# Patient Record
Sex: Male | Born: 1960 | Race: White | Hispanic: No | Marital: Married | Smoking: Former smoker
Health system: Southern US, Community
[De-identification: ages and names within clinical notes are randomized; demographics above are authoritative.]

## PROBLEM LIST (undated history)

## (undated) DIAGNOSIS — I10 Essential (primary) hypertension: Secondary | ICD-10-CM

## (undated) HISTORY — PX: APPENDECTOMY: SHX54

---

## 1997-09-28 ENCOUNTER — Encounter: Admission: RE | Admit: 1997-09-28 | Discharge: 1997-09-28 | Payer: Self-pay | Admitting: Family Medicine

## 1997-10-17 ENCOUNTER — Encounter: Admission: RE | Admit: 1997-10-17 | Discharge: 1997-10-17 | Payer: Self-pay | Admitting: Family Medicine

## 1997-10-31 ENCOUNTER — Encounter: Admission: RE | Admit: 1997-10-31 | Discharge: 1997-10-31 | Payer: Self-pay | Admitting: Family Medicine

## 1997-11-14 ENCOUNTER — Encounter: Admission: RE | Admit: 1997-11-14 | Discharge: 1997-11-14 | Payer: Self-pay | Admitting: Family Medicine

## 1997-11-23 ENCOUNTER — Encounter: Admission: RE | Admit: 1997-11-23 | Discharge: 1997-11-23 | Payer: Self-pay | Admitting: Family Medicine

## 2001-09-07 ENCOUNTER — Ambulatory Visit (HOSPITAL_COMMUNITY): Admission: RE | Admit: 2001-09-07 | Discharge: 2001-09-07 | Payer: Self-pay | Admitting: Internal Medicine

## 2001-09-07 ENCOUNTER — Encounter: Payer: Self-pay | Admitting: Internal Medicine

## 2001-09-09 ENCOUNTER — Encounter: Payer: Self-pay | Admitting: *Deleted

## 2001-09-10 ENCOUNTER — Observation Stay (HOSPITAL_COMMUNITY): Admission: EM | Admit: 2001-09-10 | Discharge: 2001-09-10 | Payer: Self-pay | Admitting: Emergency Medicine

## 2001-10-19 ENCOUNTER — Emergency Department (HOSPITAL_COMMUNITY): Admission: EM | Admit: 2001-10-19 | Discharge: 2001-10-19 | Payer: Self-pay | Admitting: Emergency Medicine

## 2004-02-21 ENCOUNTER — Ambulatory Visit: Payer: Self-pay | Admitting: Family Medicine

## 2005-03-18 ENCOUNTER — Ambulatory Visit: Payer: Self-pay | Admitting: Family Medicine

## 2005-09-04 ENCOUNTER — Ambulatory Visit: Payer: Self-pay | Admitting: Family Medicine

## 2012-06-01 ENCOUNTER — Emergency Department (HOSPITAL_COMMUNITY)
Admission: EM | Admit: 2012-06-01 | Discharge: 2012-06-01 | Disposition: A | Discharge status: Home / Self Care | Payer: PRIVATE HEALTH INSURANCE | Attending: Emergency Medicine | Admitting: Emergency Medicine

## 2012-06-01 ENCOUNTER — Encounter (HOSPITAL_COMMUNITY): Payer: Self-pay | Admitting: Emergency Medicine

## 2012-06-01 DIAGNOSIS — R42 Dizziness and giddiness: Secondary | ICD-10-CM

## 2012-06-01 DIAGNOSIS — H539 Unspecified visual disturbance: Secondary | ICD-10-CM | POA: Insufficient documentation

## 2012-06-01 DIAGNOSIS — F17200 Nicotine dependence, unspecified, uncomplicated: Secondary | ICD-10-CM | POA: Insufficient documentation

## 2012-06-01 DIAGNOSIS — I10 Essential (primary) hypertension: Secondary | ICD-10-CM | POA: Insufficient documentation

## 2012-06-01 DIAGNOSIS — Z79899 Other long term (current) drug therapy: Secondary | ICD-10-CM | POA: Insufficient documentation

## 2012-06-01 HISTORY — DX: Essential (primary) hypertension: I10

## 2012-06-01 LAB — COMPREHENSIVE METABOLIC PANEL
ALT: 37 U/L (ref 0–53)
AST: 24 U/L (ref 0–37)
Albumin: 4.2 g/dL (ref 3.5–5.2)
Alkaline Phosphatase: 101 U/L (ref 39–117)
BUN: 9 mg/dL (ref 6–23)
CO2: 25 mEq/L (ref 19–32)
Calcium: 9.9 mg/dL (ref 8.4–10.5)
Chloride: 103 mEq/L (ref 96–112)
Creatinine, Ser: 0.73 mg/dL (ref 0.50–1.35)
GFR calc Af Amer: >90 mL/min (ref >90)
GFR calc non Af Amer: >90 mL/min (ref >90)
Glucose, Bld: 119 mg/dL — ABNORMAL HIGH (ref 70–99)
Potassium: 4.1 mEq/L (ref 3.5–5.1)
Sodium: 138 mEq/L (ref 135–145)
Total Bilirubin: 0.4 mg/dL (ref 0.3–1.2)
Total Protein: 7.9 g/dL (ref 6.0–8.3)

## 2012-06-01 LAB — CBC WITH DIFFERENTIAL/PLATELET
Basophils Absolute: 0 10*3/uL (ref 0.0–0.1)
Basophils Relative: 0 % (ref 0–1)
Eosinophils Absolute: 0.2 10*3/uL (ref 0.0–0.7)
Eosinophils Relative: 2 % (ref 0–5)
HCT: 46.1 % (ref 39.0–52.0)
Hemoglobin: 16.1 g/dL (ref 13.0–17.0)
Lymphocytes Relative: 22 % (ref 12–46)
Lymphs Abs: 1.9 10*3/uL (ref 0.7–4.0)
MCH: 30.3 pg (ref 26.0–34.0)
MCHC: 34.9 g/dL (ref 30.0–36.0)
MCV: 86.7 fL (ref 78.0–100.0)
Monocytes Absolute: 0.6 10*3/uL (ref 0.1–1.0)
Monocytes Relative: 7 % (ref 3–12)
Neutro Abs: 5.8 10*3/uL (ref 1.7–7.7)
Neutrophils Relative %: 68 % (ref 43–77)
Platelets: 288 10*3/uL (ref 150–400)
RBC: 5.32 MIL/uL (ref 4.22–5.81)
RDW: 13 % (ref 11.5–15.5)
WBC: 8.5 10*3/uL (ref 4.0–10.5)

## 2012-06-01 MED ORDER — MECLIZINE HCL 12.5 MG PO TABS
25.0000 mg | ORAL_TABLET | Freq: Once | ORAL | Status: AC
  Administered 2012-06-01: 25 mg via ORAL
  Filled 2012-06-01: qty 2

## 2012-06-01 MED ORDER — MECLIZINE HCL 25 MG PO TABS: 25.0000 mg | ORAL_TABLET | Freq: Four times a day (QID) | ORAL | Status: DC | PRN

## 2012-06-01 NOTE — ED Notes (Signed)
Patient and visitor upset with wait time. Explained that DR Lynelle Doctor is working on discharge papers right now. Patient redirected back to room. Patient's visitor left upset and did not wait with patient.

## 2012-06-01 NOTE — ED Provider Notes (Signed)
History  This chart was scribed for Ward Givens, MD by Bennett Scrape, ED Scribe. This patient was seen in room APA17/APA17 and the patient's care was started at 2:13 PM.  CSN: [id number]  Arrival date & time 06/01/12  1355   First MD Initiated Contact with Patient 06/01/12 1413      Chief Complaint  Patient presents with  . Hypertension    Patient is a 52 y.o. male presenting with hypertension. The history is provided by the patient. No language interpreter was used.  Hypertension This is a recurrent problem. The current episode started less than 1 hour ago. The problem occurs constantly. The problem has not changed since onset.Pertinent negatives include no chest pain, no abdominal pain, no headaches and no shortness of breath. Nothing aggravates the symptoms. Nothing relieves the symptoms. He has tried nothing for the symptoms.    Scott Robbins is a 52 y.o. male who presents to the Emergency Department complaining of 7 AM of intermittent 30 second episodes of dizziness described as room spinning with changing postions with associated trouble balancing and blurred vision. Pt was seen Dr. Wende Crease at an [facility] on [address] today for the same and was sent here for HTN. BP is 188/103 in the ED. He states that he had his BP checked last year and was alittle high but has not checked it since. He denies having a family h/o HTN. He denies nausea, emesis, headache,CP, and SOB as associated symptoms. He states he had blurred vision once today. He has a flash of chest pain once while registering to the ED that lasted a second.   He is a current everyday smoker (0.5 ppd) and daily alcohol user (6-12 beers nightly to help him "relax").  PCP none  Past Medical History  Diagnosis Date  . Hypertension     Past Surgical History  Procedure Date  . Appendectomy     History reviewed. No pertinent family history.  AODM CAD  History  Substance Use Topics  .  Smoking status: Current Every Day Smoker 1/2 ppd  . Smokeless tobacco: Not on file  . Alcohol Use: Yes     Comment: daily, beer   Employed for the [employer] [place] Drinks 6-12 beers nightly "to relax me so I can sleep"   Review of Systems  Eyes: Positive for visual disturbance.  Respiratory: Negative for cough and shortness of breath.   Cardiovascular: Negative for chest pain.  Gastrointestinal: Negative for nausea, vomiting and abdominal pain.  Neurological: Positive for dizziness. Negative for headaches.  All other systems reviewed and are negative.    Allergies  Review of patient's allergies indicates no known allergies.  Home Medications   Current Outpatient Rx  Name  Route  Sig  Dispense  Refill  . THERAFLU FLU/COLD/SORE THROAT PO   Oral   Take 30 mLs by mouth every 4 (four) hours as needed. Cold/congestion.         Marland Kitchen DIPHENHYDRAMINE HCL 25 MG PO TABS   Oral   Take 50 mg by mouth every 6 (six) hours as needed. Allergies.         Doreatha Martin MULTI-SYMPTOM PO   Oral   Take 1 capsule by mouth every 4 (four) hours as needed. Cold/congestion.         Dorothyann Peng MULTI-SYMPTOM PO   Oral   Take 1 capsule by mouth every 4 (four) hours as needed. Cold/congestion.  Triage Vitals: BP 170/86  Pulse 86  Temp 98.1 F (36.7 C)  Resp 19  SpO2 99%  Vital signs normal except hypertension   Physical Exam  Nursing note and vitals reviewed. Constitutional: He is oriented to person, place, and time. He appears well-developed and well-nourished.  Non-toxic appearance. He does not appear ill. No distress.  HENT:  Head: Normocephalic and atraumatic.  Right Ear: External ear normal.  Left Ear: External ear normal.  Nose: Nose normal. No mucosal edema or rhinorrhea.  Mouth/Throat: Oropharynx is clear and moist and mucous membranes are normal. No dental abscesses or uvula swelling.  Eyes: Conjunctivae normal and EOM are normal. Pupils are equal, round, and  reactive to light.  Neck: Normal range of motion and full passive range of motion without pain. Neck supple.  Cardiovascular: Normal rate, regular rhythm and normal heart sounds.  Exam reveals no gallop and no friction rub.   No murmur heard. Pulmonary/Chest: Effort normal and breath sounds normal. No respiratory distress. He has no wheezes. He has no rhonchi. He has no rales. He exhibits no tenderness and no crepitus.  Abdominal: Soft. Normal appearance and bowel sounds are normal. He exhibits no distension. There is no tenderness. There is no rebound and no guarding.  Musculoskeletal: Normal range of motion. He exhibits no edema and no tenderness.       Moves all extremities well.   Neurological: He is alert and oriented to person, place, and time. He has normal strength. No cranial nerve deficit.  Skin: Skin is warm, dry and intact. No rash noted. No erythema. No pallor.  Psychiatric: He has a normal mood and affect. His speech is normal and behavior is normal. His mood appears not anxious.    ED Course  Procedures (including critical care time)   Medications  meclizine (ANTIVERT) tablet 25 mg (25 mg Oral Given 06/01/12 1443)     DIAGNOSTIC STUDIES: Oxygen Saturation is 99% on room air, normal by my interpretation.    COORDINATION OF CARE: 2:34 PM- Advised pt that alcohol can cause HTN. Wife defends he needs to drink alcohol to sleep.  Discussed treatment plan which includes meclizine and CBC panel with pt at bedside and pt agreed to plan.   14:45 PM- Ordered 25 mg Antivert tablet  15:30 pt is able to stand up with mild light headedness, waiting for his BP to be checked again.  16:45 BP has improved to 139/91 without treatment. Wife upset, states she has called and can't get him a PCP, advised they can go back to the urgent care they went to today to be reseen. She is also upset that he isn't getting HTN meds, but at this point they aren't indicated. Pt wife states she has a blood  pressure monitor at home.    Results for orders placed during the hospital encounter of 06/01/12  CBC WITH DIFFERENTIAL      Component Value Range   WBC 8.5  4.0 - 10.5 K/uL   RBC 5.32  4.22 - 5.81 MIL/uL   Hemoglobin 16.1  13.0 - 17.0 g/dL   HCT 14.7  82.9 - 56.2 %   MCV 86.7  78.0 - 100.0 fL   MCH 30.3  26.0 - 34.0 pg   MCHC 34.9  30.0 - 36.0 g/dL   RDW 13.0  86.5 - 78.4 %   Platelets 288  150 - 400 K/uL   Neutrophils Relative 68  43 - 77 %   Neutro Abs 5.8  1.7 - 7.7 K/uL   Lymphocytes Relative 22  12 - 46 %   Lymphs Abs 1.9  0.7 - 4.0 K/uL   Monocytes Relative 7  3 - 12 %   Monocytes Absolute 0.6  0.1 - 1.0 K/uL   Eosinophils Relative 2  0 - 5 %   Eosinophils Absolute 0.2  0.0 - 0.7 K/uL   Basophils Relative 0  0 - 1 %   Basophils Absolute 0.0  0.0 - 0.1 K/uL  COMPREHENSIVE METABOLIC PANEL      Component Value Range   Sodium 138  135 - 145 mEq/L   Potassium 4.1  3.5 - 5.1 mEq/L   Chloride 103  96 - 112 mEq/L   CO2 25  19 - 32 mEq/L   Glucose, Bld 119 (*) 70 - 99 mg/dL   BUN 9  6 - 23 mg/dL   Creatinine, Ser 4.09  0.50 - 1.35 mg/dL   Calcium 9.9  8.4 - 81.1 mg/dL   Total Protein 7.9  6.0 - 8.3 g/dL   Albumin 4.2  3.5 - 5.2 g/dL   AST 24  0 - 37 U/L   ALT 37  0 - 53 U/L   Alkaline Phosphatase 101  39 - 117 U/L   Total Bilirubin 0.4  0.3 - 1.2 mg/dL   GFR calc non Af Amer >90  >90 mL/min   GFR calc Af Amer >90  >90 mL/min   Laboratory interpretation all normal except non-fasting glucose    1. Dizziness     New Prescriptions   MECLIZINE (ANTIVERT) 25 MG TABLET    Take 1 tablet (25 mg total) by mouth 4 (four) times daily as needed for dizziness.    Plan discharge  Devoria Albe, MD, FACEP   MDM    I personally performed the services described in this documentation, which was scribed in my presence. The recorded information has been reviewed and considered.  Devoria Albe, MD, Armando Gang    Ward Givens, MD 06/01/12 [phone]

## 2012-06-01 NOTE — ED Notes (Signed)
Pt seen at urgent care today due to dizziness. Had high bp and sent here. Dizziness mostly with moving around. Stable on feet. Denies h/a. C/o sharp pain "running up my chest" for about 2 seconds earlier. Nad. No pcp. Noncompliant. intermittant blurred vision x 3-4 weeks. Denies weakness, trouble swallowing or slurred speech

## 2012-08-03 ENCOUNTER — Encounter (INDEPENDENT_AMBULATORY_CARE_PROVIDER_SITE_OTHER): Payer: Self-pay | Admitting: *Deleted

## 2012-08-30 ENCOUNTER — Other Ambulatory Visit (HOSPITAL_COMMUNITY): Payer: Self-pay | Admitting: Internal Medicine

## 2012-08-30 DIAGNOSIS — E0590 Thyrotoxicosis, unspecified without thyrotoxic crisis or storm: Secondary | ICD-10-CM

## 2012-09-01 ENCOUNTER — Ambulatory Visit (HOSPITAL_COMMUNITY)
Admission: RE | Admit: 2012-09-01 | Discharge: 2012-09-01 | Disposition: A | Discharge status: Home / Self Care | Payer: PRIVATE HEALTH INSURANCE | Source: Ambulatory Visit | Attending: Internal Medicine | Admitting: Internal Medicine

## 2012-09-01 DIAGNOSIS — E0590 Thyrotoxicosis, unspecified without thyrotoxic crisis or storm: Secondary | ICD-10-CM | POA: Insufficient documentation

## 2012-09-01 IMAGING — US US SOFT TISSUE HEAD/NECK
1 series · 14 of 25 positions shown · non-contrast
Comparison: none

Thyroid ultrasound
HISTORY: Hyperthyroidism.

[Series 1: us soft tissue head/neck · 0.07mm/px · 14 of 29 slices shown]
[im 1/29]
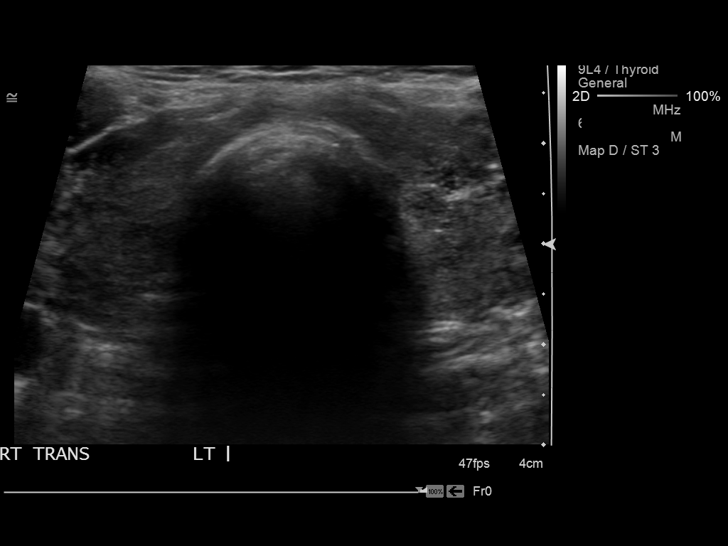
[im 3/29]
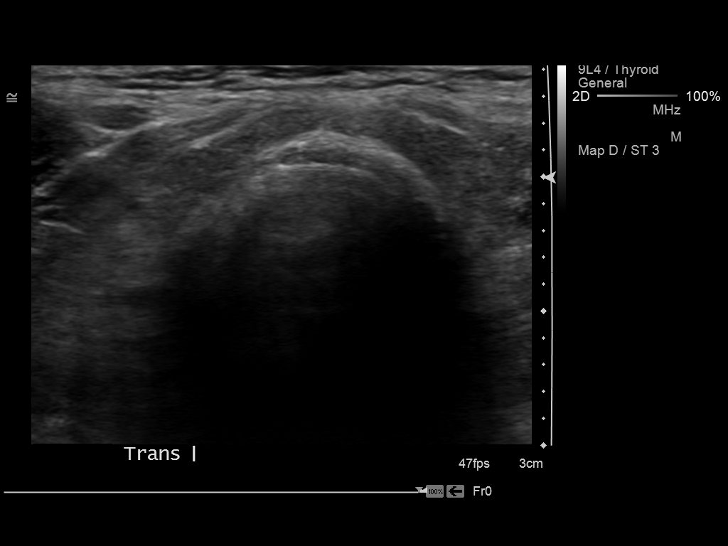
[im 5/29]
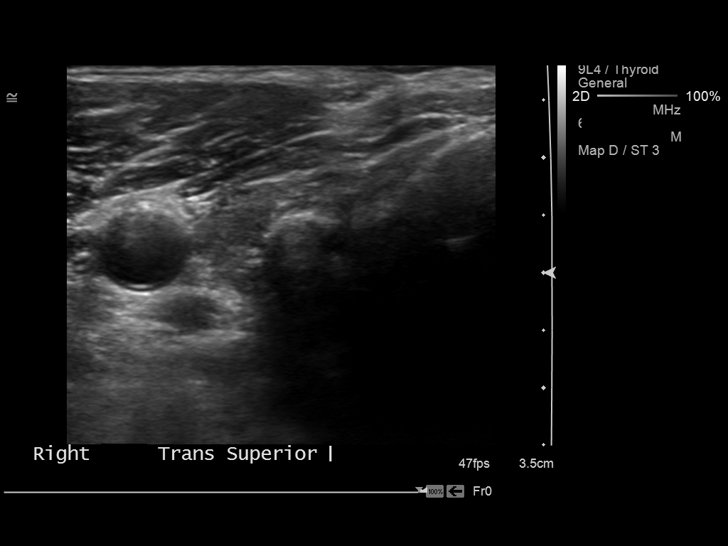
[im 8/29]
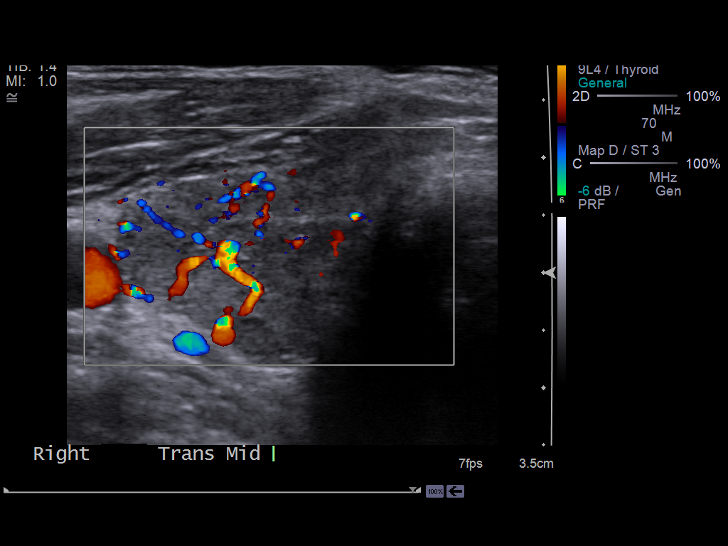
[im 10/29]
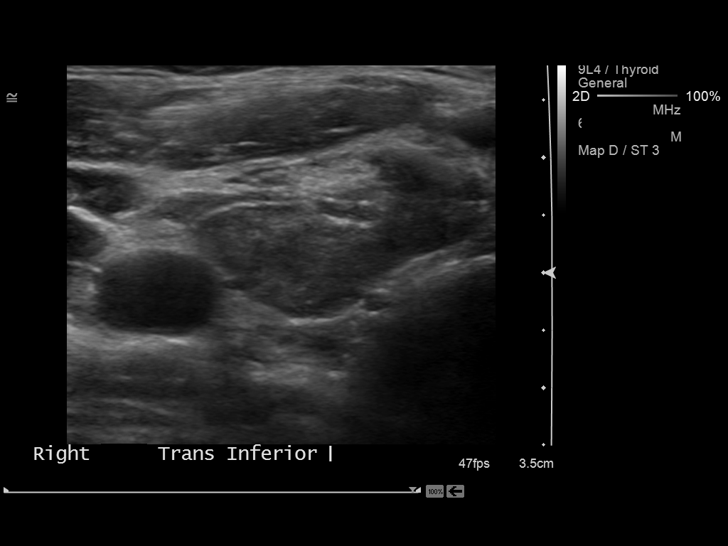
[im 11/29]
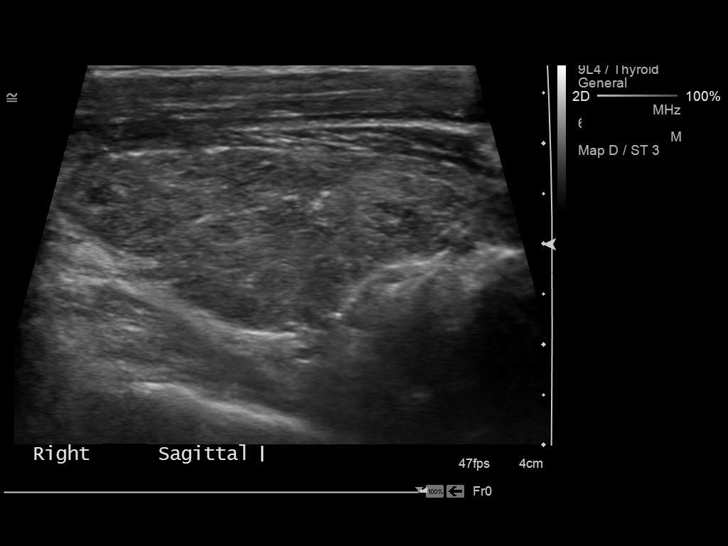
[im 13/29]
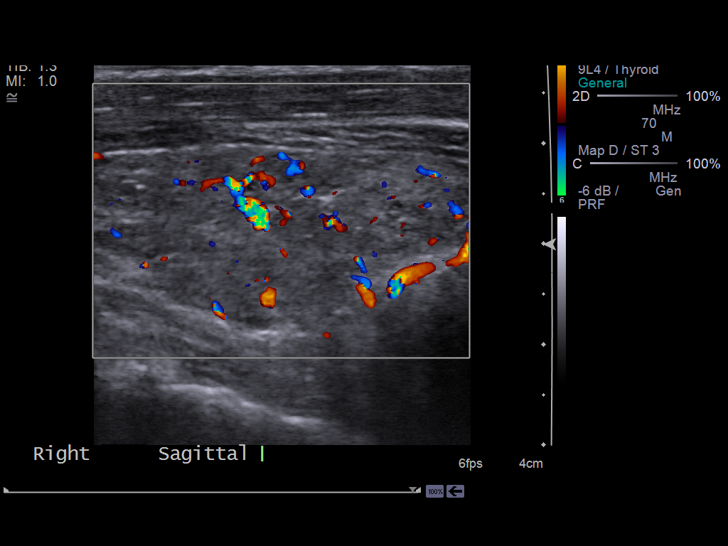
[im 16/29]
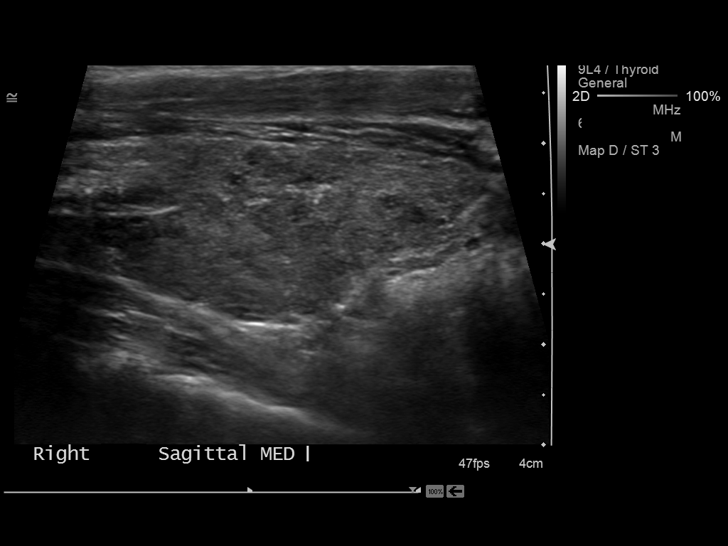
[im 18/29]
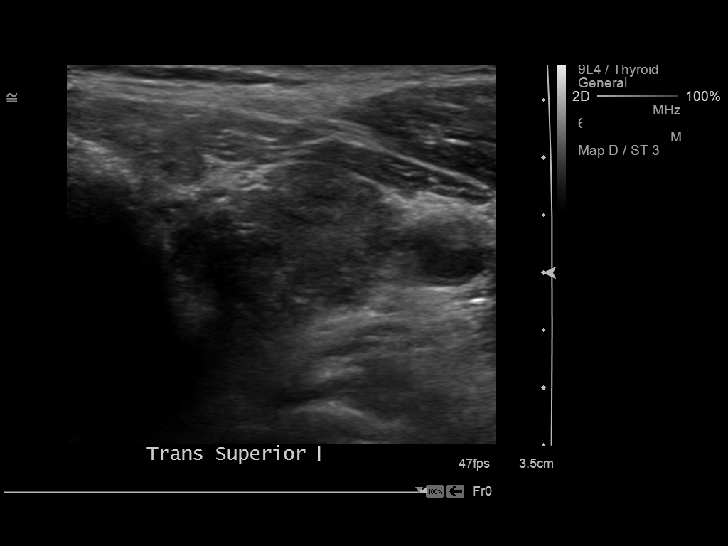
[im 19/29]
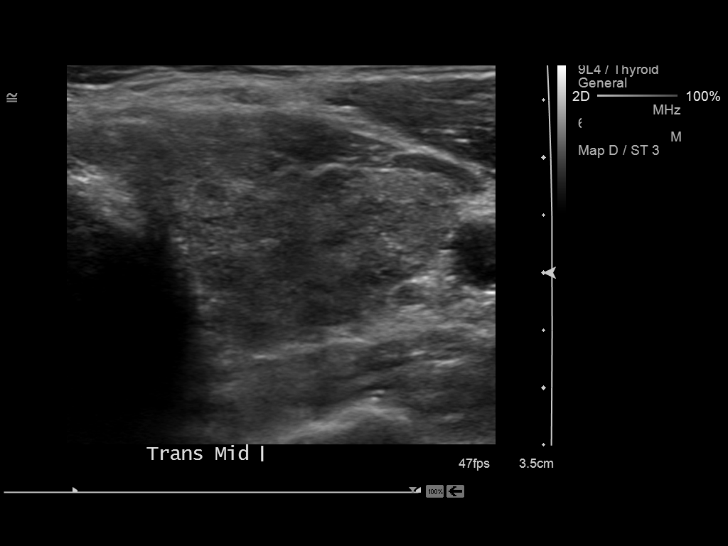
[im 22/29]
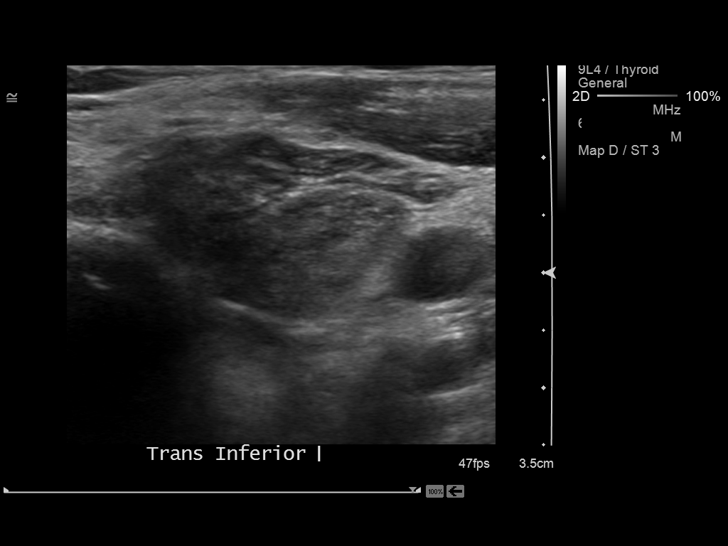
[im 24/29]
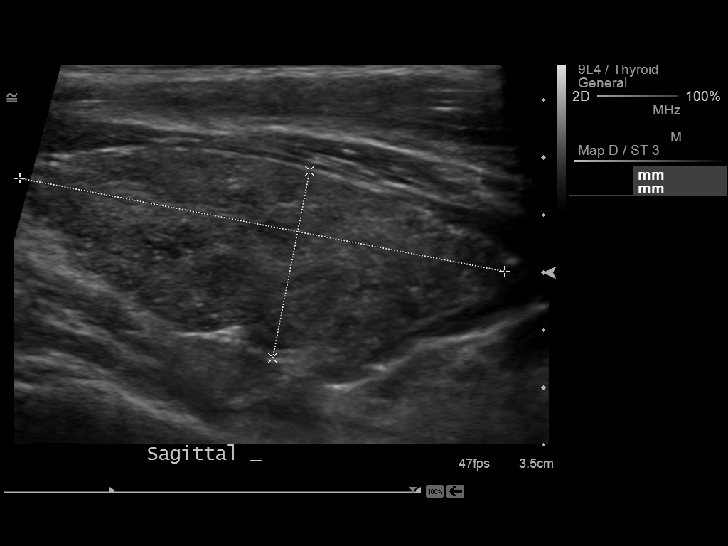
[im 26/29]
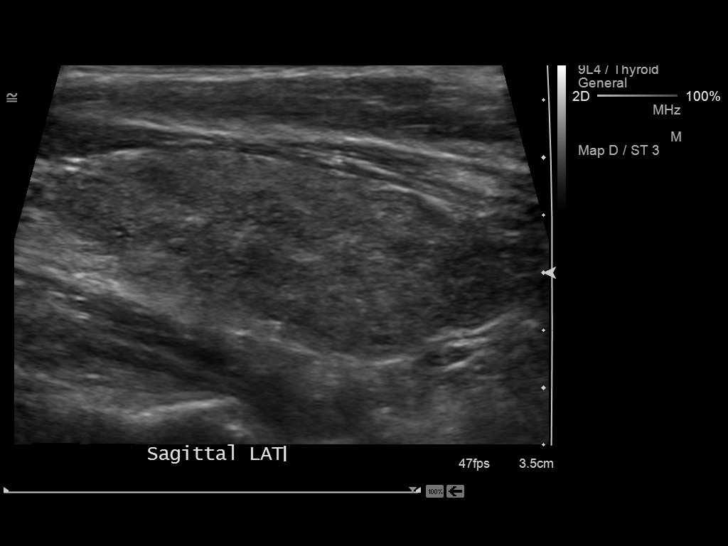
[im 29/29]
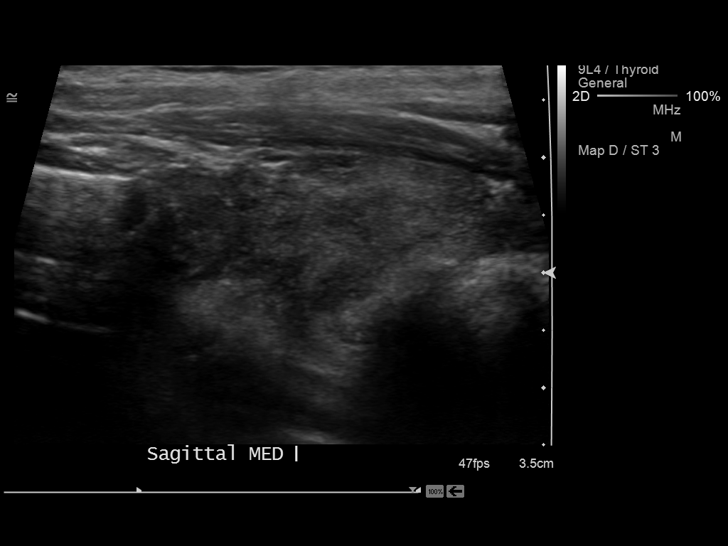

[14 of 25 positions shown; findings below may reference images not displayed]

FINDINGS: The right lobe measures 4.7 x 1.9 x 2.2 cm in size.  The
left lobe measures 4.3 x 1.7 x 2.4 cm in size.  The isthmus
measures 2 mm in thickness.

There is diffuse inhomogeneity throughout the thyroid without focal
mass.  No calcifications are noted.  The thyroid shows moderate
diffuse vascularity.

There are no masses or adenopathy adjacent to the thyroid.
CONCLUSION: Diffuse inhomogeneity to the echotexture of the
thyroid without focal mass.  While this appearance may be seen with
multinodular goiter, this appearance is consistent with thyroiditis
or Graves disease.  Appropriate laboratory correlation with respect
to potential thyroiditis or Graves disease is advised.  Study
otherwise unremarkable.

## 2013-09-08 ENCOUNTER — Other Ambulatory Visit (HOSPITAL_COMMUNITY): Payer: Self-pay | Admitting: "Endocrinology

## 2013-09-08 DIAGNOSIS — E0590 Thyrotoxicosis, unspecified without thyrotoxic crisis or storm: Secondary | ICD-10-CM

## 2013-09-12 ENCOUNTER — Encounter (HOSPITAL_COMMUNITY)
Admission: RE | Admit: 2013-09-12 | Discharge: 2013-09-12 | Disposition: A | Discharge status: Home / Self Care | Payer: PRIVATE HEALTH INSURANCE | Source: Ambulatory Visit | Attending: "Endocrinology | Admitting: "Endocrinology

## 2013-09-12 ENCOUNTER — Encounter (HOSPITAL_COMMUNITY): Payer: Self-pay

## 2013-09-12 DIAGNOSIS — E0590 Thyrotoxicosis, unspecified without thyrotoxic crisis or storm: Secondary | ICD-10-CM | POA: Insufficient documentation

## 2013-09-12 MED ORDER — SODIUM IODIDE I 131 CAPSULE
9.0000 | Freq: Once | INTRAVENOUS | Status: AC | PRN
  Administered 2013-09-12: 9 via ORAL

## 2013-09-13 ENCOUNTER — Encounter (HOSPITAL_COMMUNITY)
Admission: RE | Admit: 2013-09-13 | Discharge: 2013-09-13 | Disposition: A | Discharge status: Home / Self Care | Payer: PRIVATE HEALTH INSURANCE | Source: Ambulatory Visit | Attending: "Endocrinology | Admitting: "Endocrinology

## 2013-09-13 ENCOUNTER — Encounter (HOSPITAL_COMMUNITY): Payer: Self-pay

## 2013-09-13 MED ORDER — SODIUM PERTECHNETATE TC 99M INJECTION
10.0000 | Freq: Once | INTRAVENOUS | Status: AC | PRN
  Administered 2013-09-13: 10 via INTRAVENOUS

## 2016-12-31 ENCOUNTER — Telehealth: Payer: Self-pay

## 2016-12-31 NOTE — Telephone Encounter (Addendum)
Gastroenterology Pre-Procedure Review  Request Date: Requesting Physician: DR.HALL  FIRST [facility]CS   PATIENT REVIEW QUESTIONS: The patient responded to the following health history questions as indicated:    1. Diabetes Melitis: NO 2. Joint replacements in the past 12 months: NO 3. Major health problems in the past 3 months: NO 4. Has an artificial valve or MVP: NO 5. Has a defibrillator: NO 6. Has been advised in past to take antibiotics in advance of a procedure like teeth cleaning: NO 7. Family history of colon cancer: NO 8. Alcohol Use: 6 PACK A WEEK 9. History of sleep apnea: NO 10. History of coronary artery or other vascular stents placed within the last 12 months: NO 11. History of any prior anesthesia complications: NO    MEDICATIONS & ALLERGIES:    Patient reports the following regarding taking any blood thinners:   Plavix? NO Aspirin? NO Coumadin? NO Brilinta? NO Xarelto? NO Eliquis? NO Pradaxa? NO Savaysa? NO Effient? NO  Patient confirms/reports the following medications:  Current Outpatient Prescriptions  Medication Sig Dispense Refill  . atenolol (TENORMIN) 50 MG tablet Take 50 mg by mouth daily.    . Omega-3 Fatty Acids (FISH OIL) 1200 MG CPDR Take 1,200 mg by mouth daily.     No current facility-administered medications for this visit.     Patient confirms/reports the following allergies:  No Known Allergies  No orders of the defined types were placed in this encounter.   AUTHORIZATION INFORMATION Primary Insurance: [employer],  ID #: [id number] ,  Group #: [id number]  Pre-Cert / Auth required:  Pre-Cert / Auth #:   Secondary Insurance: ,  ID #: ,  Group #:  Pre-Cert / Auth required:  Pre-Cert / Auth #:   SCHEDULE INFORMATION: Procedure has been scheduled as follows:  Date: , Time:   Location:   This Gastroenterology Pre-Precedure Review Form is being routed to the following provider(s):

## 2017-01-07 NOTE — Telephone Encounter (Signed)
Ok to schedule with 12.5 mg preprocedure Phenergan due to 6 pack of beer a week

## 2017-01-07 NOTE — Telephone Encounter (Signed)
Tried to call with no answer  

## 2017-01-25 NOTE — Telephone Encounter (Signed)
Tried to call with no answer. I will mail out a letter for him to call us.

## 2017-02-01 ENCOUNTER — Telehealth: Payer: Self-pay | Admitting: Gastroenterology

## 2017-02-01 ENCOUNTER — Other Ambulatory Visit: Payer: Self-pay

## 2017-02-01 DIAGNOSIS — Z1211 Encounter for screening for malignant neoplasm of colon: Secondary | ICD-10-CM

## 2017-02-01 MED ORDER — PEG 3350-KCL-NA BICARB-NACL 420 G PO SOLR: 4000.0000 mL | ORAL | 0 refills | Status: DC

## 2017-02-01 NOTE — Addendum Note (Signed)
Addended by: Everardo All on: 02/01/2017 10:55 AM   Modules accepted: Orders

## 2017-02-01 NOTE — Telephone Encounter (Signed)
[phone]  PLEASE CALL PATIENT WIFE SHE HAS QUESTIONS ABOUT HER HUSBANDS TCS THAT IS UPCOMING

## 2017-02-01 NOTE — Telephone Encounter (Signed)
I spoke to pt's wife and pt is scheduled for 02/12/2017 at 1:15 pm with Dr. Gala Romney. Rx sent to the pharmacy and instructions mailed to pt.

## 2017-02-01 NOTE — Telephone Encounter (Signed)
See triage

## 2017-02-05 ENCOUNTER — Other Ambulatory Visit: Payer: Self-pay

## 2017-02-11 ENCOUNTER — Telehealth: Payer: Self-pay | Admitting: Internal Medicine
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
## 2017-02-15 NOTE — Telephone Encounter (Signed)
Routing to clinical pool to reschedule procedure

## 2017-02-15 NOTE — Telephone Encounter (Signed)
Unable to contact pt. Phone rings busy. Wife is listed as emergency contact with the same number. Letter mailed to pt.

## 2017-02-17 ENCOUNTER — Other Ambulatory Visit: Payer: Self-pay

## 2017-02-17 MED ORDER — PEG 3350-KCL-NA BICARB-NACL 420 G PO SOLR: 4000.0000 mL | ORAL | 0 refills | Status: DC

## 2017-02-17 NOTE — Telephone Encounter (Signed)
Pt's wife called office to reschedule his colonoscopy with RMR. Procedure rescheduled to 02/26/17 at 3:00pm. He had already drank his prep. New prep rx sent to pharmacy. She requested for instructions to be faxed to pharmacy. Instructions faxed. LMOVM for Endo scheduler.

## 2017-02-26 ENCOUNTER — Encounter (HOSPITAL_COMMUNITY): Payer: Self-pay | Admitting: *Deleted

## 2017-02-26 ENCOUNTER — Ambulatory Visit (HOSPITAL_COMMUNITY)
Admission: RE | Admit: 2017-02-26 | Discharge: 2017-02-26 | Disposition: A | Discharge status: Home / Self Care | Payer: PRIVATE HEALTH INSURANCE | Source: Ambulatory Visit | Attending: Internal Medicine | Admitting: Internal Medicine

## 2017-02-26 ENCOUNTER — Encounter (HOSPITAL_COMMUNITY)
Admission: RE | Disposition: A | Discharge status: Home / Self Care | Payer: Self-pay | Source: Ambulatory Visit | Attending: Internal Medicine

## 2017-02-26 DIAGNOSIS — Z1211 Encounter for screening for malignant neoplasm of colon: Secondary | ICD-10-CM | POA: Diagnosis not present

## 2017-02-26 DIAGNOSIS — F17210 Nicotine dependence, cigarettes, uncomplicated: Secondary | ICD-10-CM | POA: Diagnosis not present

## 2017-02-26 DIAGNOSIS — Z79899 Other long term (current) drug therapy: Secondary | ICD-10-CM | POA: Insufficient documentation

## 2017-02-26 DIAGNOSIS — D124 Benign neoplasm of descending colon: Secondary | ICD-10-CM | POA: Diagnosis not present

## 2017-02-26 DIAGNOSIS — Z1212 Encounter for screening for malignant neoplasm of rectum: Secondary | ICD-10-CM | POA: Diagnosis not present

## 2017-02-26 DIAGNOSIS — I10 Essential (primary) hypertension: Secondary | ICD-10-CM | POA: Insufficient documentation

## 2017-02-26 HISTORY — PX: COLONOSCOPY: SHX5424

## 2017-02-26 HISTORY — PX: POLYPECTOMY: SHX5525

## 2017-02-26 SURGERY — COLONOSCOPY
Anesthesia: Moderate Sedation

## 2017-02-26 MED ORDER — SIMETHICONE 40 MG/0.6ML PO SUSP
ORAL | Status: DC | PRN
  Administered 2017-02-26: 2.5 mL

## 2017-02-26 MED ORDER — MIDAZOLAM HCL 5 MG/5ML IJ SOLN
INTRAMUSCULAR | Status: DC | PRN
  Administered 2017-02-26 (×2): 2 mg via INTRAVENOUS

## 2017-02-26 MED ORDER — MEPERIDINE HCL 100 MG/ML IJ SOLN
INTRAMUSCULAR | Status: DC | PRN
Start: 2017-02-26 — End: 2017-02-26
  Administered 2017-02-26: 50 mg via INTRAVENOUS
  Administered 2017-02-26: 25 mg via INTRAVENOUS

## 2017-02-26 MED ORDER — PROMETHAZINE HCL 25 MG/ML IJ SOLN
12.5000 mg | Freq: Once | INTRAMUSCULAR | Status: AC
Start: 2017-02-26 — End: 2017-02-26
  Administered 2017-02-26: 12.5 mg via INTRAVENOUS

## 2017-02-26 MED ORDER — ONDANSETRON HCL 4 MG/2ML IJ SOLN
INTRAMUSCULAR | Status: AC
  Filled 2017-02-26: qty 2

## 2017-02-26 MED ORDER — SODIUM CHLORIDE 0.9% FLUSH
INTRAVENOUS | Status: AC
  Filled 2017-02-26: qty 10

## 2017-02-26 MED ORDER — SODIUM CHLORIDE 0.9 % IV SOLN
INTRAVENOUS | Status: DC
  Administered 2017-02-26: 14:00:00 via INTRAVENOUS

## 2017-02-26 MED ORDER — MIDAZOLAM HCL 5 MG/5ML IJ SOLN
INTRAMUSCULAR | Status: AC
  Filled 2017-02-26: qty 10

## 2017-02-26 MED ORDER — MEPERIDINE HCL 100 MG/ML IJ SOLN
INTRAMUSCULAR | Status: AC
  Filled 2017-02-26: qty 2

## 2017-02-26 MED ORDER — PROMETHAZINE HCL 25 MG/ML IJ SOLN
INTRAMUSCULAR | Status: AC
  Filled 2017-02-26: qty 1

## 2017-02-26 NOTE — Op Note (Signed)
[facility] Patient Name: Scott Robbins Procedure Date: 02/26/2017 2:03 PM MRN: [id number] Date of Birth: 02-06-1961 Attending MD: Scott Robbins , MD CSN: [id number] Age: 56 Admit Type: Outpatient Procedure:                Colonoscopy Indications:              Screening for colorectal malignant neoplasm Providers:                Scott Richards, MD, Lurline Del, RN, Purcell Nails.                            [place], Merchant [employer] officer Referring MD:              Medicines:                Midazolam 4 mg IV, Meperidine 75 mg IV,                            Promethazine 12.5 mg IV, Ondansetron 4 mg IV Complications:            No immediate complications. Estimated Blood Loss:     Estimated blood loss was minimal. Procedure:                Pre-Anesthesia Assessment:                           - Prior to the procedure, a History and Physical                            was performed, and patient medications and                            allergies were reviewed. The patient's tolerance of                            previous anesthesia was also reviewed. The risks                            and benefits of the procedure and the sedation                            options and risks were discussed with the patient.                            All questions were answered, and informed consent                            was obtained. Prior Anticoagulants: The patient has                            taken no previous anticoagulant or antiplatelet                            agents. ASA Grade Assessment: II - A patient with  mild systemic disease. After reviewing the risks                            and benefits, the patient was deemed in                            satisfactory condition to undergo the procedure.                           After obtaining informed consent, the colonoscope                            was passed under direct vision. Throughout the            procedure, the patient's blood pressure, pulse, and                            oxygen saturations were monitored continuously. The                            EC-[id number] ([id number]) scope was introduced through                            the anus and advanced to the the cecum, identified                            by appendiceal orifice and ileocecal valve. The                            entire colon was well visualized. The ileocecal                            valve, appendiceal orifice, and rectum were                            photographed. The ileocecal valve, appendiceal                            orifice, and rectum were photographed. The                            colonoscopy was performed without difficulty. The                            patient tolerated the procedure well. The quality                            of the bowel preparation was adequate. Scope In: 2:12:15 PM Scope Out: 2:24:25 PM Scope Withdrawal Time: 0 hours 8 minutes 26 seconds  Total Procedure Duration: 0 hours 12 minutes 10 seconds  Findings:      The perianal and digital rectal examinations were normal.      A 6 mm polyp was found in the descending colon. The polyp was sessile.       The polyp was removed with a cold  snare. Resection and retrieval were       complete. Estimated blood loss was minimal.      The exam was otherwise without abnormality on direct and retroflexion       views. Impression:               - One 6 mm polyp in the descending colon, removed                            with a cold snare. Resected and retrieved.                           - The examination was otherwise normal on direct                            and retroflexion views. Moderate Sedation:      Moderate (conscious) sedation was administered by the endoscopy nurse       and supervised by the endoscopist. The following parameters were       monitored: oxygen saturation, heart rate, blood pressure, respiratory        rate, EKG, adequacy of pulmonary ventilation, and response to care.       Total physician intraservice time was 17 minutes. Recommendation:           - Patient has a contact number available for                            emergencies. The signs and symptoms of potential                            delayed complications were discussed with the                            patient. Return to normal activities tomorrow.                            Written discharge instructions were provided to the                            patient.                           - Resume previous diet.                           - Continue present medications.                           - Repeat colonoscopy date to be determined after                            pending pathology results are reviewed for                            surveillance based on pathology results.                           -  Return to GI clinic (date not yet determined). Procedure Code(s):        --- Professional ---                           [phone], Colonoscopy, flexible; with removal of                            tumor(s), polyp(s), or other lesion(s) by snare                            technique                           99152, Moderate sedation services provided by the                            same physician or other qualified health care                            professional performing the diagnostic or                            therapeutic service that the sedation supports,                            requiring the presence of an independent trained                            observer to assist in the monitoring of the                            patient's level of consciousness and physiological                            status; initial 15 minutes of intraservice time,                            patient age 10 years or older Diagnosis Code(s):        --- Professional ---                           Z12.11, Encounter for screening for  malignant                            neoplasm of colon                           D12.4, Benign neoplasm of descending colon CPT copyright 2016 American Medical Association. All rights reserved. The codes documented in this report are preliminary and upon coder review may  be revised to meet current compliance requirements. Scott Robbins. Scott Brinkmeier, MD Scott Richards, MD 02/26/2017 2:30:36 PM This report has been signed electronically. Number of Addenda: 0

## 2017-02-26 NOTE — H&P (Signed)
@  JSHF@   Primary Care Physician:  Celene Squibb, MD Primary Gastroenterologist:  Dr. Gala Romney  Pre-Procedure History & Physical: HPI:  Scott Robbins is a 56 y.o. male here for first ever average risk screening colonoscopy.  Past Medical History:  Diagnosis Date  . Hypertension     Past Surgical History:  Procedure Laterality Date  . APPENDECTOMY      Prior to Admission medications   Medication Sig Start Date End Date Taking? Authorizing Provider  atenolol (TENORMIN) 50 MG tablet Take 50 mg by mouth daily.   Yes [provider]  polyethylene glycol-electrolytes (TRILYTE) 420 g solution Take 4,000 mLs by mouth as directed. 02/01/17  Yes Latesa Fratto, Cristopher Estimable, MD  polyethylene glycol-electrolytes (TRILYTE) 420 g solution Take 4,000 mLs by mouth as directed. 02/17/17   Daneil Dolin, MD    Allergies as of 02/01/2017  . (No Known Allergies)    Family History  Problem Relation Age of Onset  . Colon cancer Neg Hx     Social History   Social History  . Marital status: Married    Spouse name: N/A  . Number of children: N/A  . Years of education: N/A   Occupational History  . Not on file.   Social History Main Topics  . Smoking status: Current Every Day Smoker    Packs/day: 0.50    Years: 25.00    Types: Cigarettes  . Smokeless tobacco: Never Used  . Alcohol use 3.0 oz/week    5 Cans of beer per week     Comment: daily, beer  . Drug use: No  . Sexual activity: Not on file   Other Topics Concern  . Not on file   Social History Narrative  . No narrative on file    Review of Systems: See HPI, otherwise negative ROS  Physical Exam: BP (!) 153/95   Pulse 92   Temp 98.1 F (36.7 C) (Oral)   Resp (!) 22   Ht 5\' 11"  (1.803 m)   Wt 187 lb (84.8 kg)   SpO2 98%   BMI 26.08 kg/m  General:   Alert,  Well-developed, well-nourished, pleasant and cooperative in NAD Lungs:  Clear throughout to auscultation.   No wheezes, crackles, or rhonchi. No acute  distress. Heart:  Regular rate and rhythm; no murmurs, clicks, rubs,  or gallops. Abdomen: Non-distended, normal bowel sounds.  Soft and nontender without appreciable mass or hepatosplenomegaly.  Pulses:  Normal pulses noted. Extremities:  Without clubbing or edema.  Impression/plan :  56 year old gentleman here for first ever average risk screening colonoscopy.  The risks, benefits, limitations, alternatives and imponderables have been reviewed with the patient. Questions have been answered. All parties are agreeable.      Notice: This dictation was prepared with Dragon dictation along with smaller phrase technology. Any transcriptional errors that result from this process are unintentional and may not be corrected upon review.

## 2017-02-26 NOTE — Discharge Instructions (Addendum)
°Colonoscopy °Discharge Instructions ° °Read the instructions outlined below and refer to this sheet in the next few weeks. These discharge instructions provide you with general information on caring for yourself after you leave the hospital. Your doctor may also give you specific instructions. While your treatment has been planned according to the most current medical practices available, unavoidable complications occasionally occur. If you have any problems or questions after discharge, call Dr. Rourk at [phone]. °ACTIVITY °· You may resume your regular activity, but move at a slower pace for the next 24 hours.  °· Take frequent rest periods for the next 24 hours.  °· Walking will help get rid of the air and reduce the bloated feeling in your belly (abdomen).  °· No driving for 24 hours (because of the medicine (anesthesia) used during the test).   °· Do not sign any important legal documents or operate any machinery for 24 hours (because of the anesthesia used during the test).  °NUTRITION °· Drink plenty of fluids.  °· You may resume your normal diet as instructed by your doctor.  °· Begin with a light meal and progress to your normal diet. Heavy or fried foods are harder to digest and may make you feel sick to your stomach (nauseated).  °· Avoid alcoholic beverages for 24 hours or as instructed.  °MEDICATIONS °· You may resume your normal medications unless your doctor tells you otherwise.  °WHAT YOU CAN EXPECT TODAY °· Some feelings of bloating in the abdomen.  °· Passage of more gas than usual.  °· Spotting of blood in your stool or on the toilet paper.  °IF YOU HAD POLYPS REMOVED DURING THE COLONOSCOPY: °· No aspirin products for 7 days or as instructed.  °· No alcohol for 7 days or as instructed.  °· Eat a soft diet for the next 24 hours.  °FINDING OUT THE RESULTS OF YOUR TEST °Not all test results are available during your visit. If your test results are not back during the visit, make an appointment  with your caregiver to find out the results. Do not assume everything is normal if you have not heard from your caregiver or the medical facility. It is important for you to follow up on all of your test results.  °SEEK IMMEDIATE MEDICAL ATTENTION IF: °· You have more than a spotting of blood in your stool.  °· Your belly is swollen (abdominal distention).  °· You are nauseated or vomiting.  °· You have a temperature over 101.  °· You have abdominal pain or discomfort that is severe or gets worse throughout the day.  ° ° °Colon polyp information provided ° °Further recommendations to follow pending review of pathology report. ° ° °Colon Polyps °Polyps are tissue growths inside the body. Polyps can grow in many places, including the large intestine (colon). A polyp may be a round bump or a mushroom-shaped growth. You could have one polyp or several. °Most colon polyps are noncancerous (benign). However, some colon polyps can become cancerous over time. °What are the causes? °The exact cause of colon polyps is not known. °What increases the risk? °This condition is more likely to develop in people who: °· Have a family history of colon cancer or colon polyps. °· Are older than 50 or older than 45 if they are African American. °· Have inflammatory bowel disease, such as ulcerative colitis or Crohn disease. °· Are overweight. °· Smoke cigarettes. °· Do not get enough exercise. °· Drink too much alcohol. °·   Eat a diet that is: °? High in fat and red meat. °? Low in fiber. °· Had childhood cancer that was treated with abdominal radiation. ° °What are the signs or symptoms? °Most polyps do not cause symptoms. If you have symptoms, they may include: °· Blood coming from your rectum when having a bowel movement. °· Blood in your stool. The stool may look dark red or black. °· A change in bowel habits, such as constipation or diarrhea. ° °How is this diagnosed? °This condition is diagnosed with a colonoscopy. This is a  procedure that uses a lighted, flexible scope to look at the inside of your colon. °How is this treated? °Treatment for this condition involves removing any polyps that are found. Those polyps will then be tested for cancer. If cancer is found, your health care provider will talk to you about options for colon cancer treatment. °Follow these instructions at home: °Diet °· Eat plenty of fiber, such as fruits, vegetables, and whole grains. °· Eat foods that are high in calcium and vitamin D, such as milk, cheese, yogurt, eggs, liver, fish, and broccoli. °· Limit foods high in fat, red meats, and processed meats, such as hot dogs, sausage, bacon, and lunch meats. °· Maintain a healthy weight, or lose weight if recommended by your health care provider. °General instructions °· Do not smoke cigarettes. °· Do not drink alcohol excessively. °· Keep all follow-up visits as told by your health care provider. This is important. This includes keeping regularly scheduled colonoscopies. Talk to your health care provider about when you need a colonoscopy. °· Exercise every day or as told by your health care provider. °Contact a health care provider if: °· You have new or worsening bleeding during a bowel movement. °· You have new or increased blood in your stool. °· You have a change in bowel habits. °· You unexpectedly lose weight. °This information is not intended to replace advice given to you by your health care provider. Make sure you discuss any questions you have with your health care provider. °Document Released: 01/15/2004 Document Revised: 09/26/2015 Document Reviewed: 03/11/2015 °Elsevier Interactive Patient Education © 2018 [employer] ° °

## 2017-03-02 ENCOUNTER — Encounter: Payer: Self-pay | Admitting: Internal Medicine

## 2017-03-03 ENCOUNTER — Encounter (HOSPITAL_COMMUNITY): Payer: Self-pay | Admitting: Internal Medicine

## 2018-05-17 ENCOUNTER — Encounter (HOSPITAL_BASED_OUTPATIENT_CLINIC_OR_DEPARTMENT_OTHER): Payer: Self-pay

## 2018-05-17 DIAGNOSIS — G4733 Obstructive sleep apnea (adult) (pediatric): Secondary | ICD-10-CM

## 2019-07-20 ENCOUNTER — Ambulatory Visit: Payer: PRIVATE HEALTH INSURANCE

## 2019-07-21 ENCOUNTER — Ambulatory Visit: Payer: PRIVATE HEALTH INSURANCE | Attending: Internal Medicine

## 2019-07-21 DIAGNOSIS — Z23 Encounter for immunization: Secondary | ICD-10-CM

## 2019-07-21 NOTE — Progress Notes (Signed)
   Covid-19 Vaccination Clinic  Name:  Scott Robbins    MRN: [id number] DOB: [birth year]  07/21/2019  [name] was observed post Covid-19 immunization for 15 minutes without incident. He was provided with Vaccine Information Sheet and instruction to access the V-Safe system.   [name] was instructed to call 911 with any severe reactions post vaccine: Marland Kitchen Difficulty breathing  . Swelling of face and throat  . A fast heartbeat  . A bad rash all over body  . Dizziness and weakness   Immunizations Administered    Name Date Dose VIS Date Route   Moderna COVID-19 Vaccine 07/21/2019  8:35 AM 0.5 mL 04/04/2019 Intramuscular   Manufacturer: Moderna   Lot: [id number]   [id number]

## 2019-08-22 ENCOUNTER — Ambulatory Visit: Payer: PRIVATE HEALTH INSURANCE | Attending: Internal Medicine

## 2019-08-22 DIAGNOSIS — Z23 Encounter for immunization: Secondary | ICD-10-CM

## 2019-08-22 NOTE — Progress Notes (Signed)
   Covid-19 Vaccination Clinic  Name:  Scott Robbins    MRN: [id number] DOB: [birth year]  08/22/2019  [name] was observed post Covid-19 immunization for 15 minutes without incident. He was provided with Vaccine Information Sheet and instruction to access the V-Safe system.   [name] was instructed to call 911 with any severe reactions post vaccine: Marland Kitchen Difficulty breathing  . Swelling of face and throat  . A fast heartbeat  . A bad rash all over body  . Dizziness and weakness   Immunizations Administered    Name Date Dose VIS Date Route   Moderna COVID-19 Vaccine 08/22/2019  8:05 AM 0.5 mL 04/2019 Intramuscular   Manufacturer: Moderna   Lot: [id number]   [id number]

## 2019-10-11 ENCOUNTER — Emergency Department (HOSPITAL_COMMUNITY): Payer: PRIVATE HEALTH INSURANCE

## 2019-10-11 ENCOUNTER — Other Ambulatory Visit: Payer: Self-pay

## 2019-10-11 ENCOUNTER — Observation Stay (HOSPITAL_COMMUNITY): Payer: PRIVATE HEALTH INSURANCE

## 2019-10-11 ENCOUNTER — Observation Stay (HOSPITAL_COMMUNITY)
Admission: EM | Admit: 2019-10-11 | Discharge: 2019-10-12 | Disposition: A | Discharge status: Home / Self Care | Payer: PRIVATE HEALTH INSURANCE | Attending: Family Medicine | Admitting: Family Medicine

## 2019-10-11 ENCOUNTER — Encounter (HOSPITAL_COMMUNITY): Payer: Self-pay | Admitting: Emergency Medicine

## 2019-10-11 DIAGNOSIS — Z20822 Contact with and (suspected) exposure to covid-19: Secondary | ICD-10-CM | POA: Diagnosis not present

## 2019-10-11 DIAGNOSIS — E119 Type 2 diabetes mellitus without complications: Secondary | ICD-10-CM

## 2019-10-11 DIAGNOSIS — G459 Transient cerebral ischemic attack, unspecified: Secondary | ICD-10-CM | POA: Diagnosis not present

## 2019-10-11 DIAGNOSIS — R42 Dizziness and giddiness: Secondary | ICD-10-CM | POA: Diagnosis present

## 2019-10-11 DIAGNOSIS — R200 Anesthesia of skin: Secondary | ICD-10-CM | POA: Diagnosis not present

## 2019-10-11 DIAGNOSIS — E785 Hyperlipidemia, unspecified: Secondary | ICD-10-CM

## 2019-10-11 DIAGNOSIS — R0789 Other chest pain: Secondary | ICD-10-CM | POA: Insufficient documentation

## 2019-10-11 DIAGNOSIS — R079 Chest pain, unspecified: Secondary | ICD-10-CM

## 2019-10-11 DIAGNOSIS — R202 Paresthesia of skin: Secondary | ICD-10-CM | POA: Diagnosis not present

## 2019-10-11 DIAGNOSIS — Z79899 Other long term (current) drug therapy: Secondary | ICD-10-CM | POA: Diagnosis not present

## 2019-10-11 DIAGNOSIS — F17210 Nicotine dependence, cigarettes, uncomplicated: Secondary | ICD-10-CM | POA: Insufficient documentation

## 2019-10-11 DIAGNOSIS — I10 Essential (primary) hypertension: Secondary | ICD-10-CM

## 2019-10-11 LAB — PROTIME-INR
INR: 1 (ref 0.8–1.2)
Prothrombin Time: 12.6 seconds (ref 11.4–15.2)

## 2019-10-11 LAB — BASIC METABOLIC PANEL
Anion gap: 10 (ref 5–15)
BUN: 10 mg/dL (ref 6–20)
CO2: 24 mmol/L (ref 22–32)
Calcium: 9.6 mg/dL (ref 8.9–10.3)
Chloride: 103 mmol/L (ref 98–111)
Creatinine, Ser: 0.83 mg/dL (ref 0.61–1.24)
GFR calc Af Amer: >60 mL/min (ref >60)
GFR calc non Af Amer: >60 mL/min (ref >60)
Glucose, Bld: 148 mg/dL — ABNORMAL HIGH (ref 70–99)
Potassium: 3.7 mmol/L (ref 3.5–5.1)
Sodium: 137 mmol/L (ref 135–145)

## 2019-10-11 LAB — HIV ANTIBODY (ROUTINE TESTING W REFLEX): HIV Screen 4th Generation wRfx: NONREACTIVE

## 2019-10-11 LAB — CBC
HCT: 47.1 % (ref 39.0–52.0)
Hemoglobin: 16.1 g/dL (ref 13.0–17.0)
MCH: 31.1 pg (ref 26.0–34.0)
MCHC: 34.2 g/dL (ref 30.0–36.0)
MCV: 90.9 fL (ref 80.0–100.0)
Platelets: 288 10*3/uL (ref 150–400)
RBC: 5.18 MIL/uL (ref 4.22–5.81)
RDW: 13 % (ref 11.5–15.5)
WBC: 9.2 10*3/uL (ref 4.0–10.5)
nRBC: 0 % (ref 0.0–0.2)

## 2019-10-11 LAB — TROPONIN I (HIGH SENSITIVITY)
Troponin I (High Sensitivity): <2 ng/L (ref <18)
Troponin I (High Sensitivity): 2 ng/L (ref <18)

## 2019-10-11 LAB — SARS CORONAVIRUS 2 BY RT PCR (HOSPITAL ORDER, PERFORMED IN ~~LOC~~ HOSPITAL LAB): SARS Coronavirus 2: NEGATIVE

## 2019-10-11 IMAGING — MR MR MRA HEAD W/O CM
1 series · 16 of 48 positions shown · non-contrast
Comparison: Brain MRI and head CT today reported separately.

CLINICAL DATA: 59-year-old male with episodes of blurred vision,
numbness and tingling.

EXAM:
MRA HEAD WITHOUT CONTRAST
TECHNIQUE: Angiographic images of the Circle of Willis were obtained using MRA
technique without intravenous contrast.

[Series 1: TOF fat-sat · axial · 0.8mm · 0.38mm/px · z∈[-50,+49]mm · 16 of 131 slices shown]
[im 1/131]
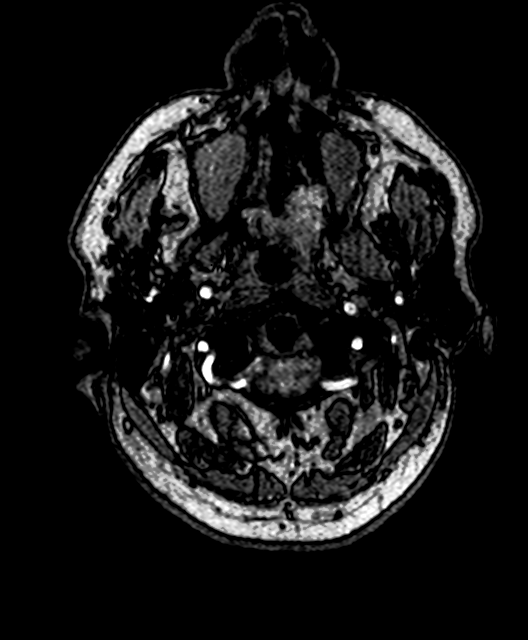
[im 3/131]
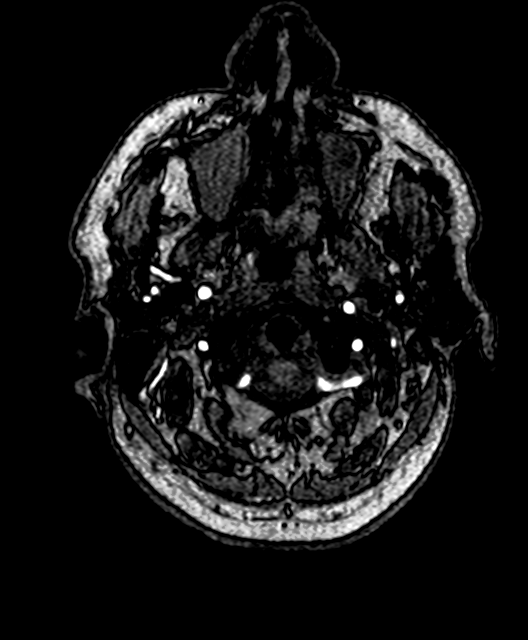
[im 6/131]
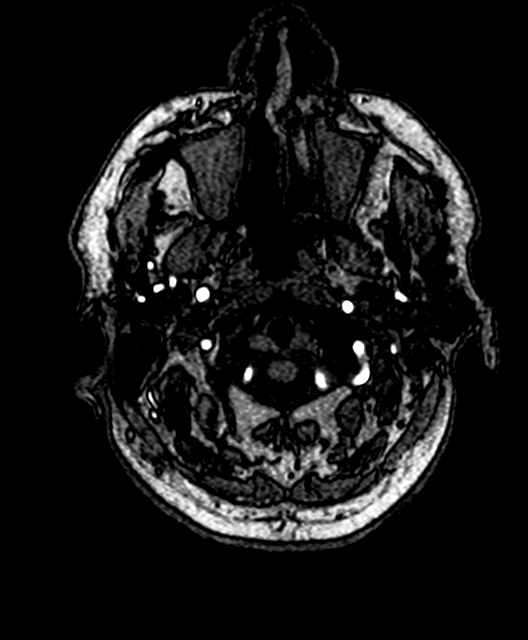
[im 9/131]
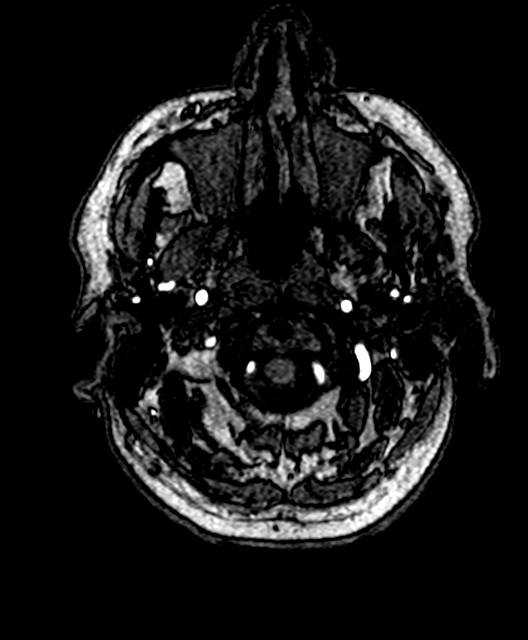
[im 12/131]
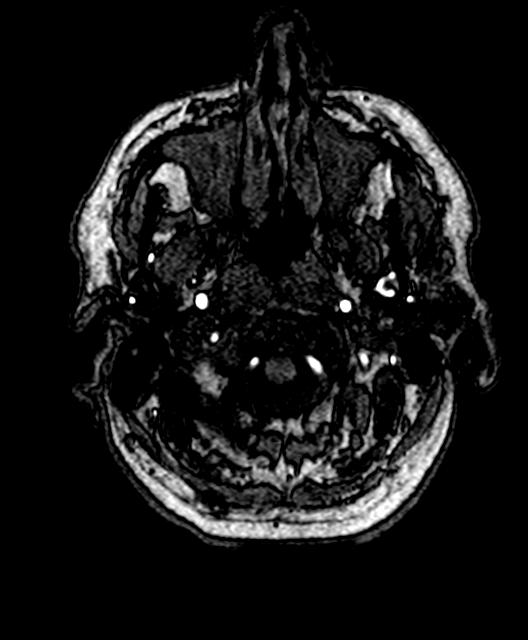
[im 14/131]
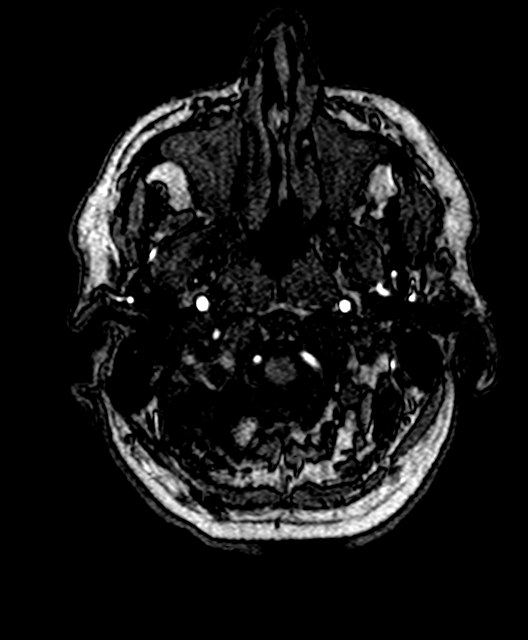
[im 23/131]
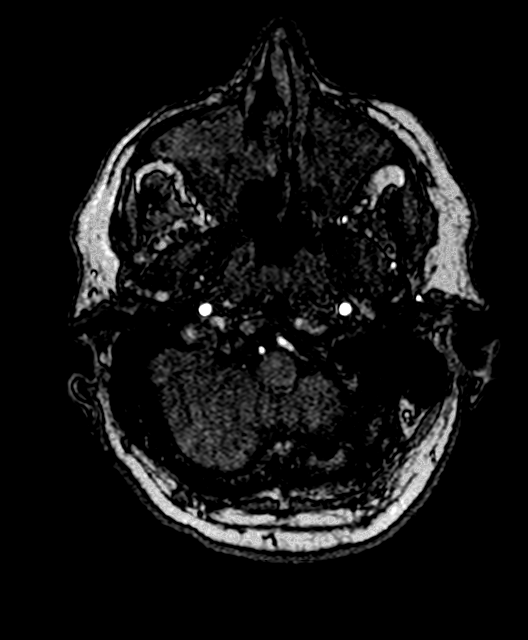
[im 25/131]
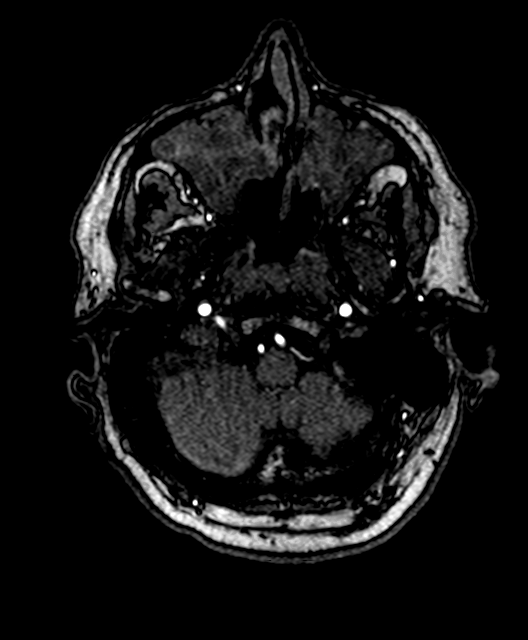
[im 42/131]
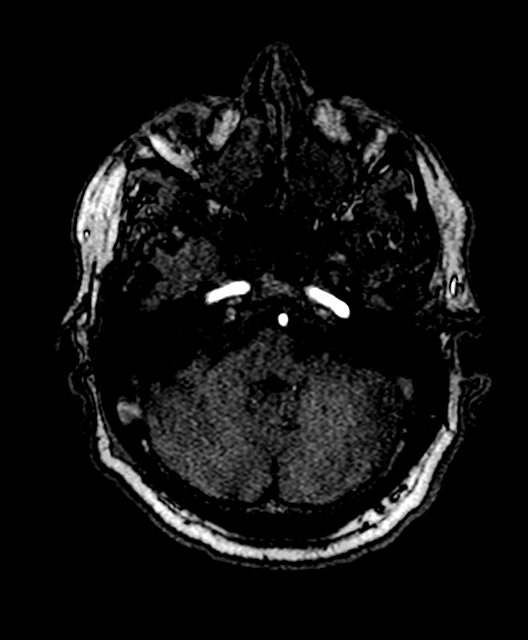
[im 59/131]
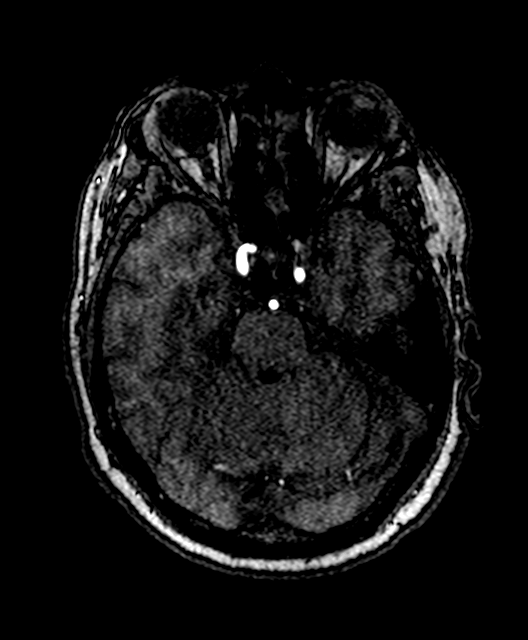
[im 67/131]
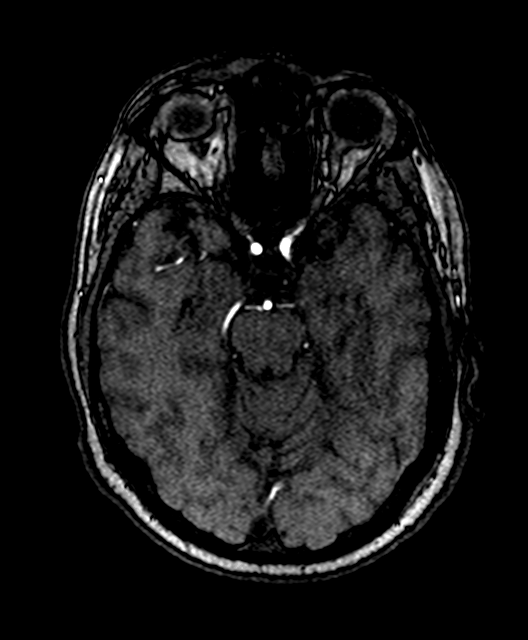
[im 75/131]
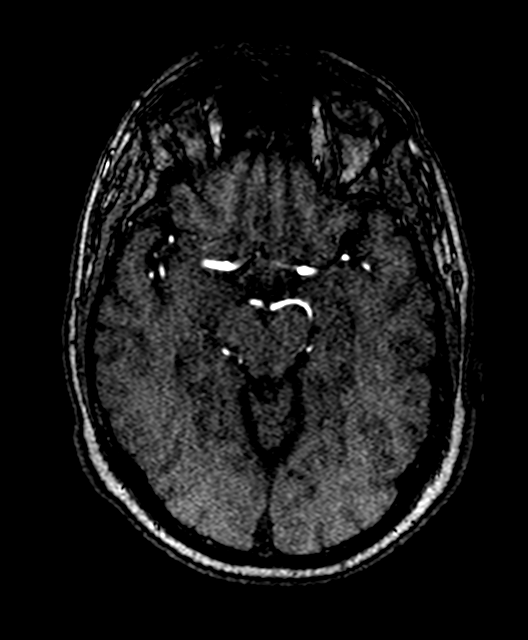
[im 92/131]
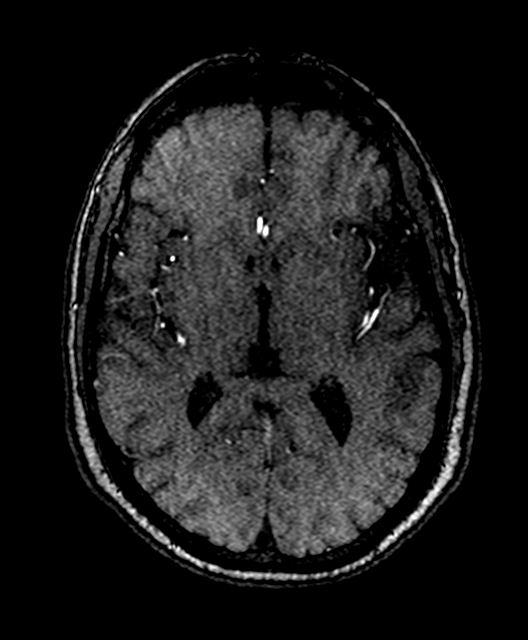
[im 108/131]
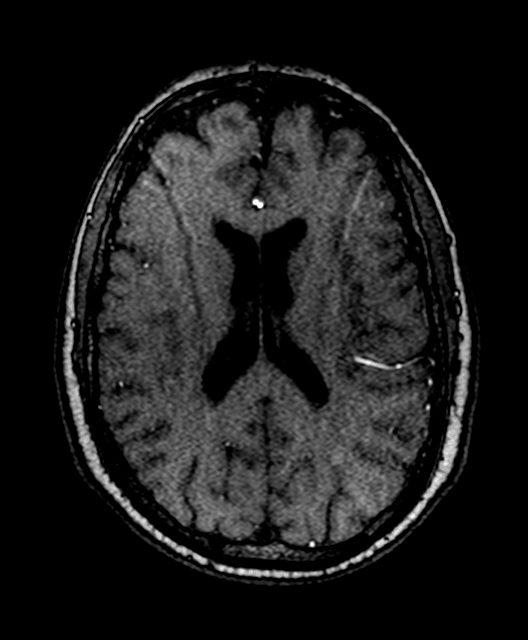
[im 111/131]
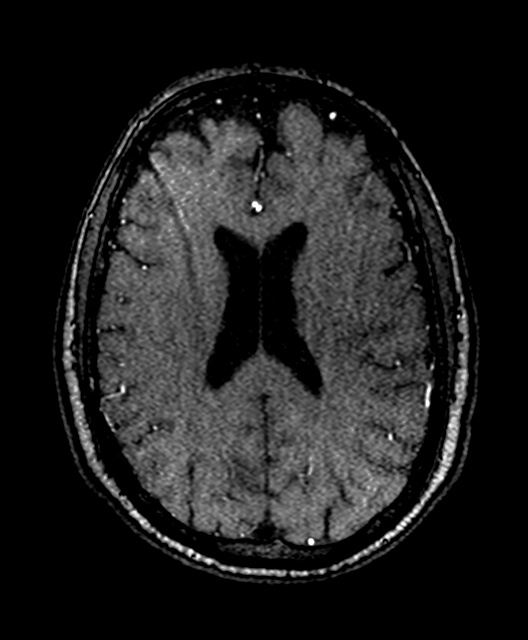
[im 125/131]
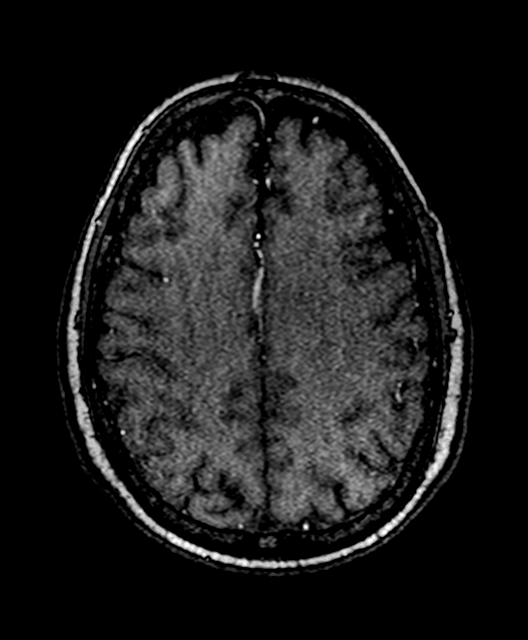

[16 of 48 positions shown; findings below may reference images not displayed]

FINDINGS: Antegrade flow in the posterior circulation. Mildly dominant left
vertebral artery. Normal PICA origins and no distal vertebral
stenosis. Patent basilar artery without stenosis. Patent SCA and PCA
origins. Posterior communicating arteries are diminutive or absent.
Bilateral PCA branches are within normal limits.

Antegrade flow in both ICA siphons. The visible ICAs appear patent
without stenosis. Normal ophthalmic artery origins. Patent carotid
termini, normal MCA and ACA origins. Anterior communicating artery
and visible ACA branches are within normal limits. MCA M1 segments,
MCA bifurcations and visible bilateral MCA branches are within
normal limits.
IMPRESSION: 1. Negative intracranial MRA.
2. See also Brain MRI today reported separately.

## 2019-10-11 IMAGING — MR MR LUMBAR SPINE WO/W CM
4 of 8 series · 14 of 48 positions shown · IV contrast (9 ML Gadavist)
Comparison: None.

CLINICAL DATA: Leg weakness, numbness and tingling.

EXAM:
MRI LUMBAR SPINE WITHOUT AND WITH CONTRAST
TECHNIQUE: Multiplanar and multiecho pulse sequences of the lumbar spine were
obtained without and with intravenous contrast.
CONTRAST:  9mL GADAVIST GADOBUTROL 1 MMOL/ML IV SOLN

[Series 5: T2 · sagittal · 4.0mm · 0.47mm/px · 3 of 17 slices shown (1 of 4)]
[im 1/17]
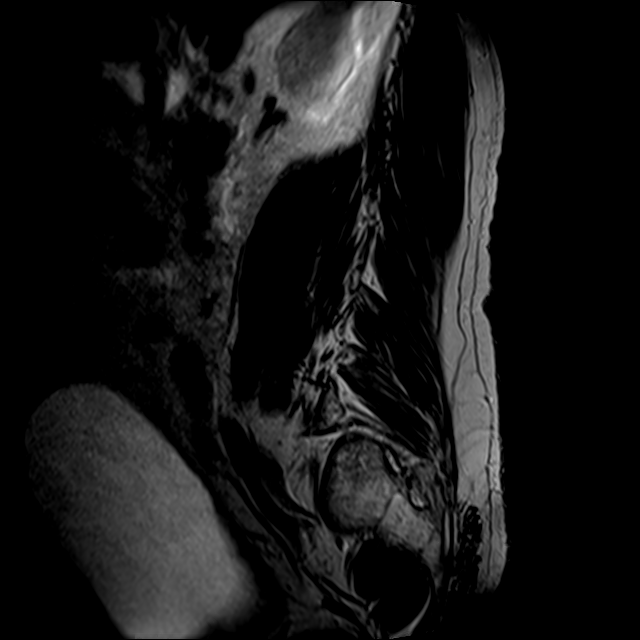
[im 9/17]
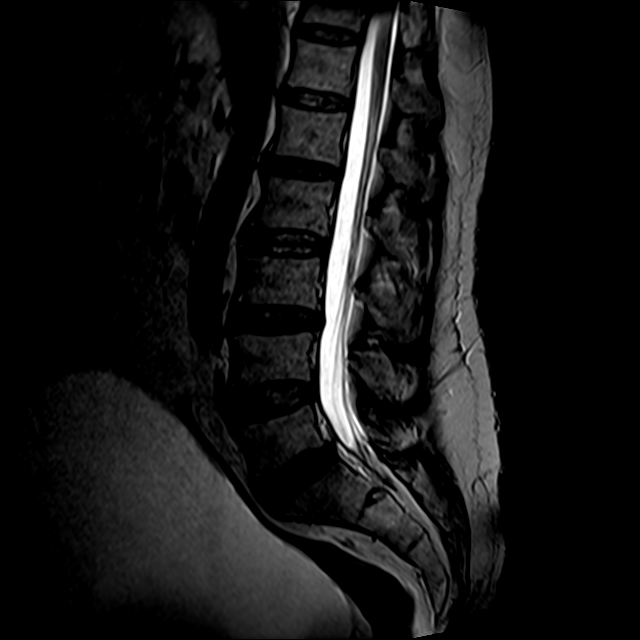
[im 17/17]
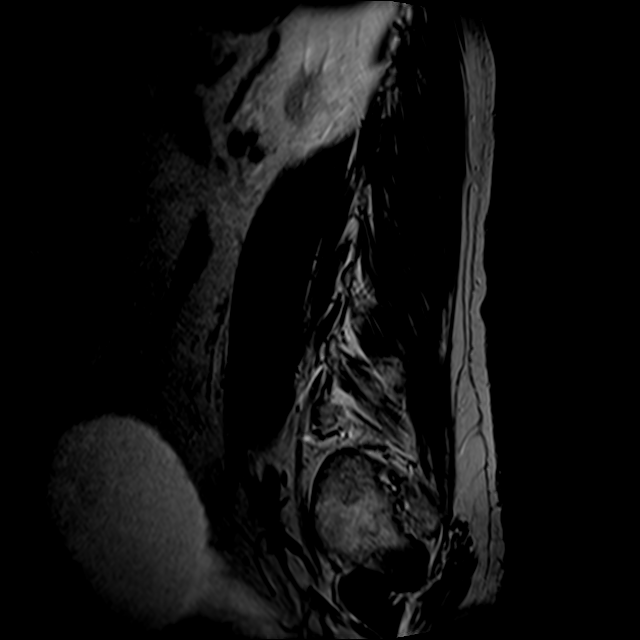

[Series 7: T2 · sagittal · 4.0mm · 0.47mm/px · 4 of 18 slices shown (2 of 4)]
[im 1/18]
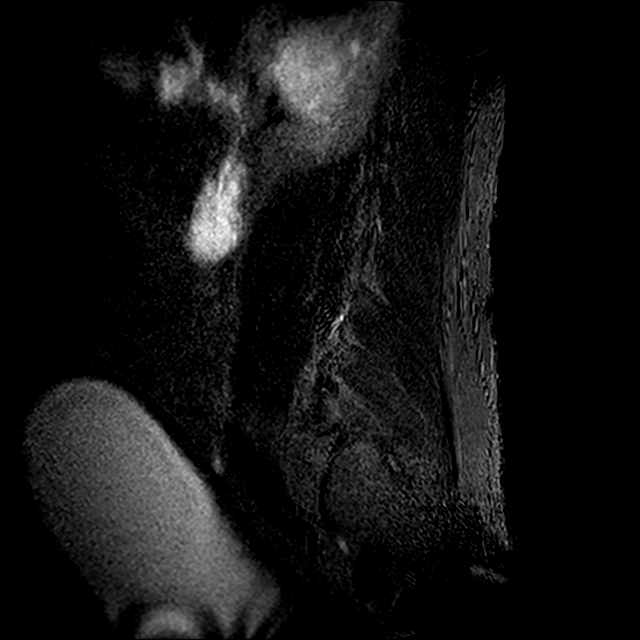
[im 6/18]
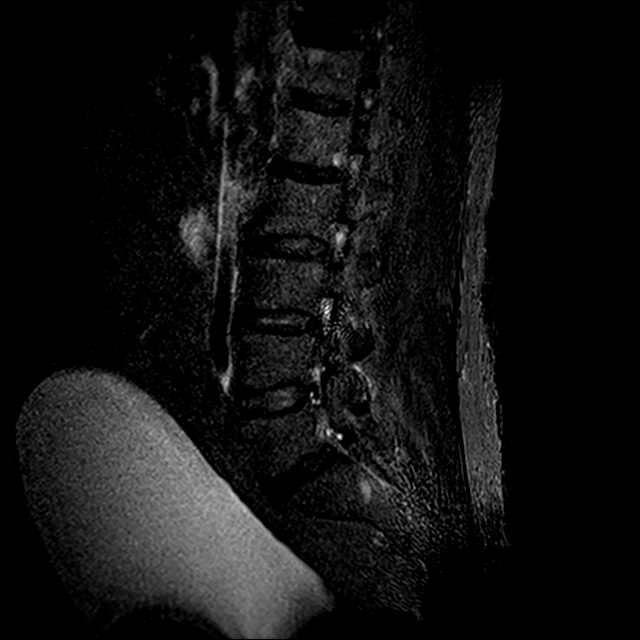
[im 12/18]
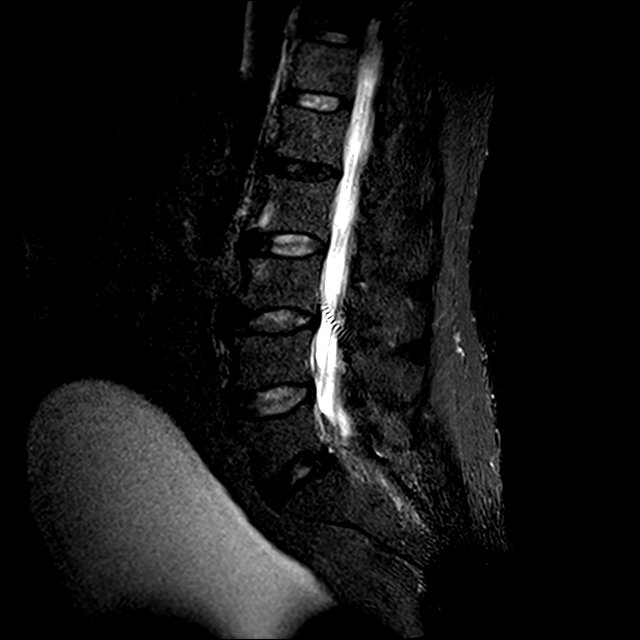
[im 18/18]
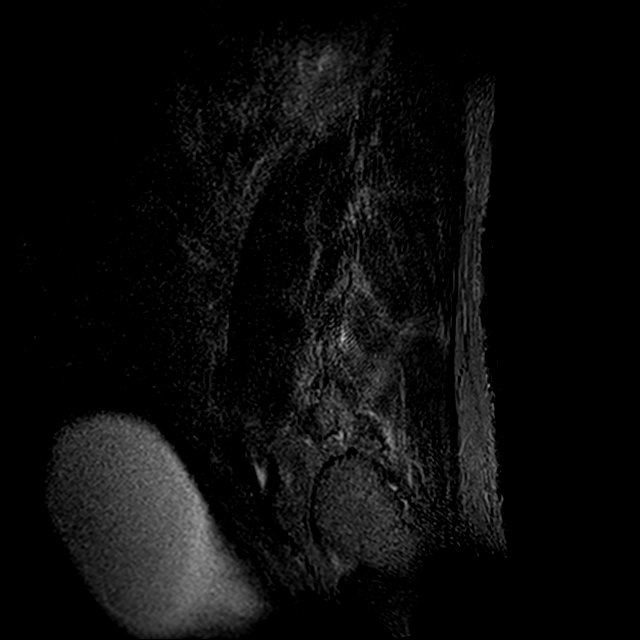

[Series 8: T2 · axial · 4.0mm · 0.33mm/px · z∈[-662,-469]mm · 4 of 44 slices shown (3 of 4)]
[im 1/44]
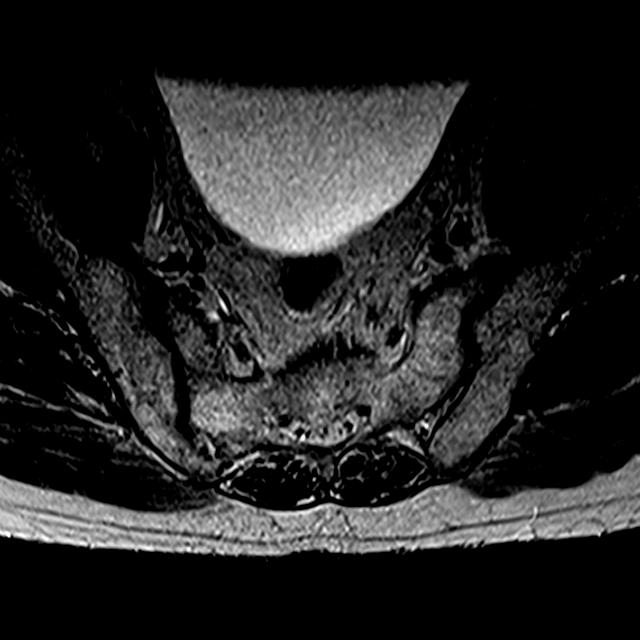
[im 5/44]
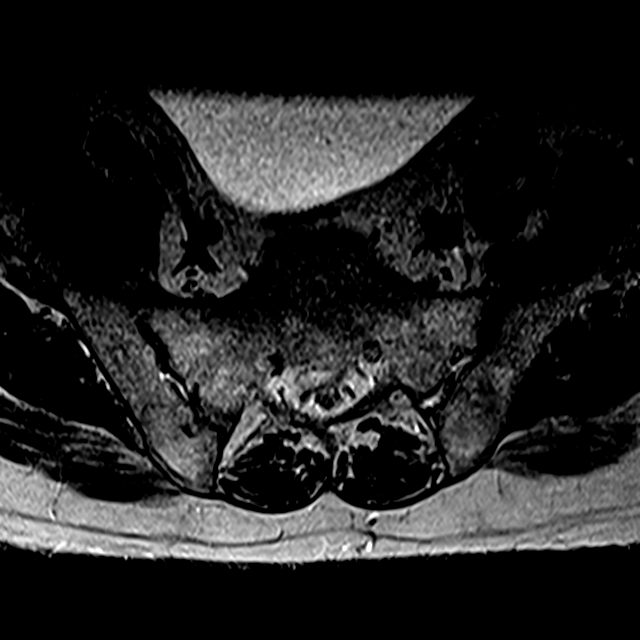
[im 24/44]
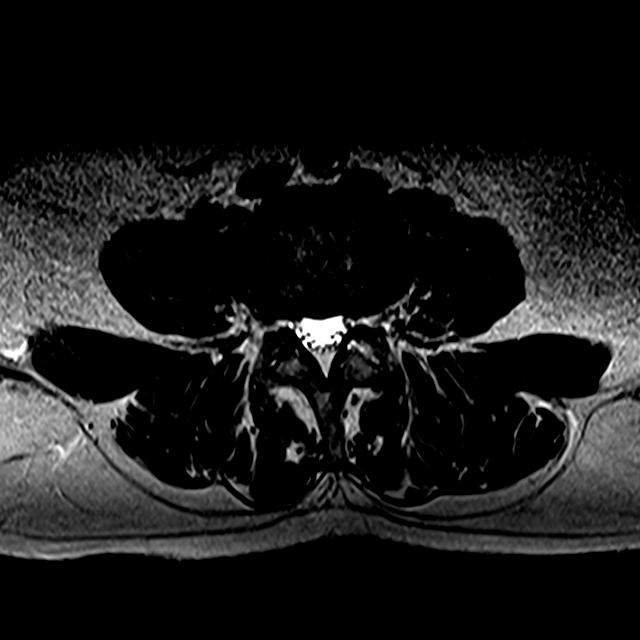
[im 39/44]
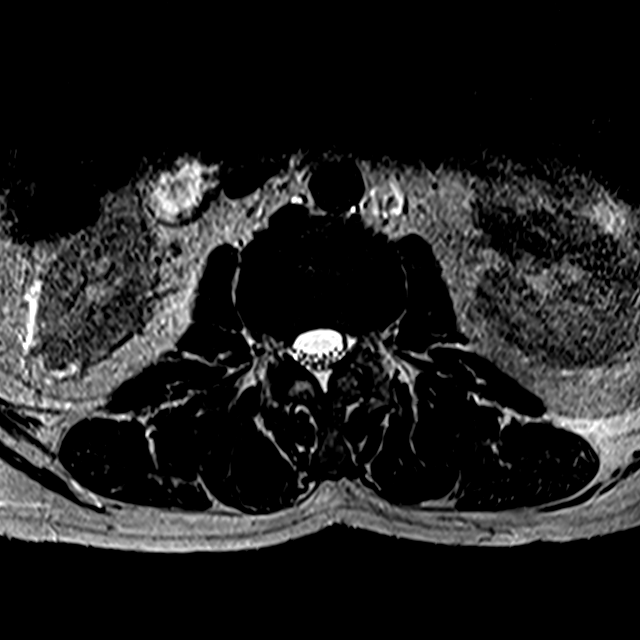

[Series 10: T2 · sagittal · 4.0mm · 0.55mm/px · 3 of 17 slices shown (4 of 4)]
[im 1/17]
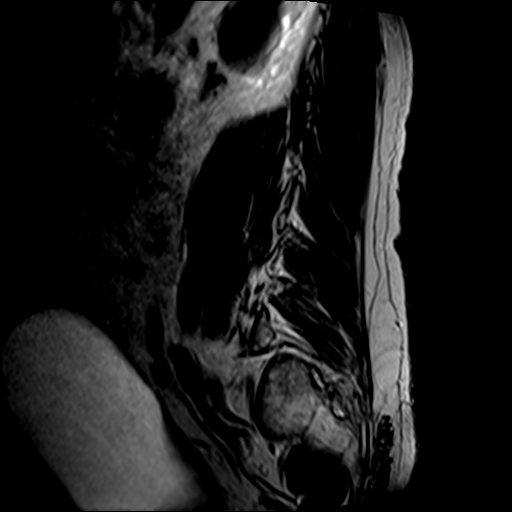
[im 11/17]
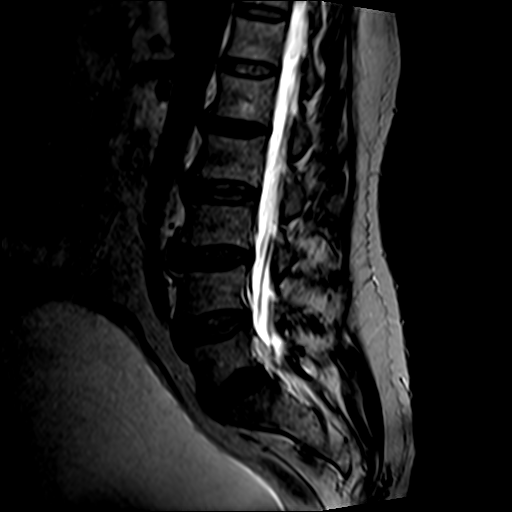
[im 17/17]
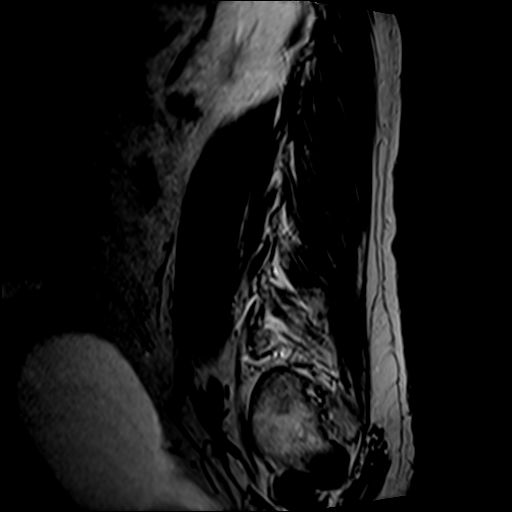

[14 of 48 positions shown; findings below may reference images not displayed]

FINDINGS: Segmentation: There are five lumbar type vertebral bodies. The last
full intervertebral disc space is labeled L5-S1.

Alignment:  Normal

Vertebrae:  Normal marrow signal.  No bone lesions or fractures.

Conus medullaris and cauda equina: Conus extends to the T12-L1
level. Conus and cauda equina appear normal.

Paraspinal and other soft tissues: No significant paraspinal or
retroperitoneal findings. There is moderate distention of the
bladder noted estimated volume is 750 cc.

Disc levels:

L1-2: Mild facet disease but no disc protrusions, spinal or
foraminal stenosis.

L2-3: Mild facet disease but no disc protrusions, spinal or
foraminal stenosis.

L3-4: Shallow central disc protrusion with mild impression on the
ventral thecal sac. No significant spinal or foraminal stenosis.
Mild to moderate facet disease.

L4-5: Right foraminal annular rent and very shallow disc protrusion
but no direct neural compression. Potential irritation of the right
L4 nerve root. No spinal or lateral recess stenosis. Mild to
moderate facet disease.

L5-S1: Diffuse annular bulge with mild flattening of the ventral
thecal sac. Moderate facet disease without definite pars defects. No
significant spinal or foraminal stenosis.
IMPRESSION: 1. Shallow central disc protrusion at L3-4 with mild impression on
the ventral thecal sac.
2. Right foraminal annular rent and very shallow disc protrusion at
L4-5 but no direct neural compression. Potential irritation of the
right L4 nerve root.
3. Moderate facet disease at L5-S1 but no definite pars defects.
4. Moderate bladder distention.

## 2019-10-11 IMAGING — DX DG CHEST 2V
2 series · 2 of 2 positions shown · non-contrast
Comparison: None.

CLINICAL DATA: Chest pain. Patient reports blurry vision.

EXAM:
CHEST - 2 VIEW

[chest pa]
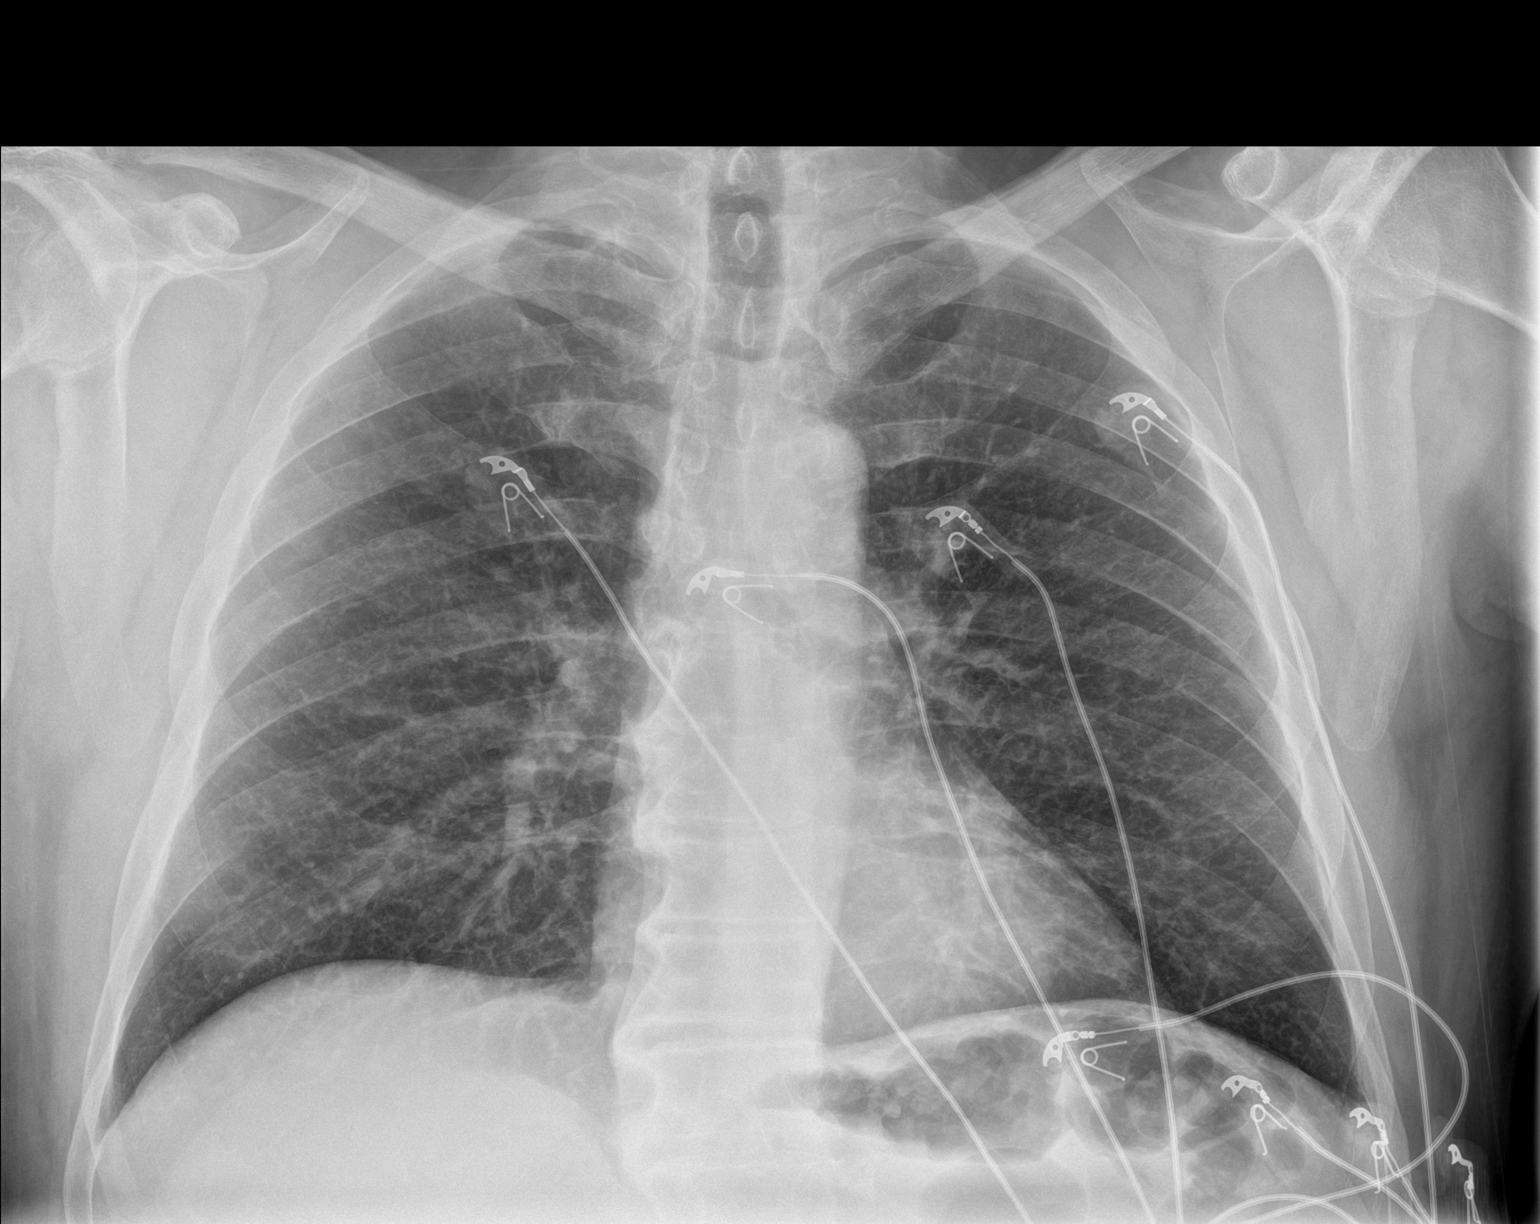

[chest lat]
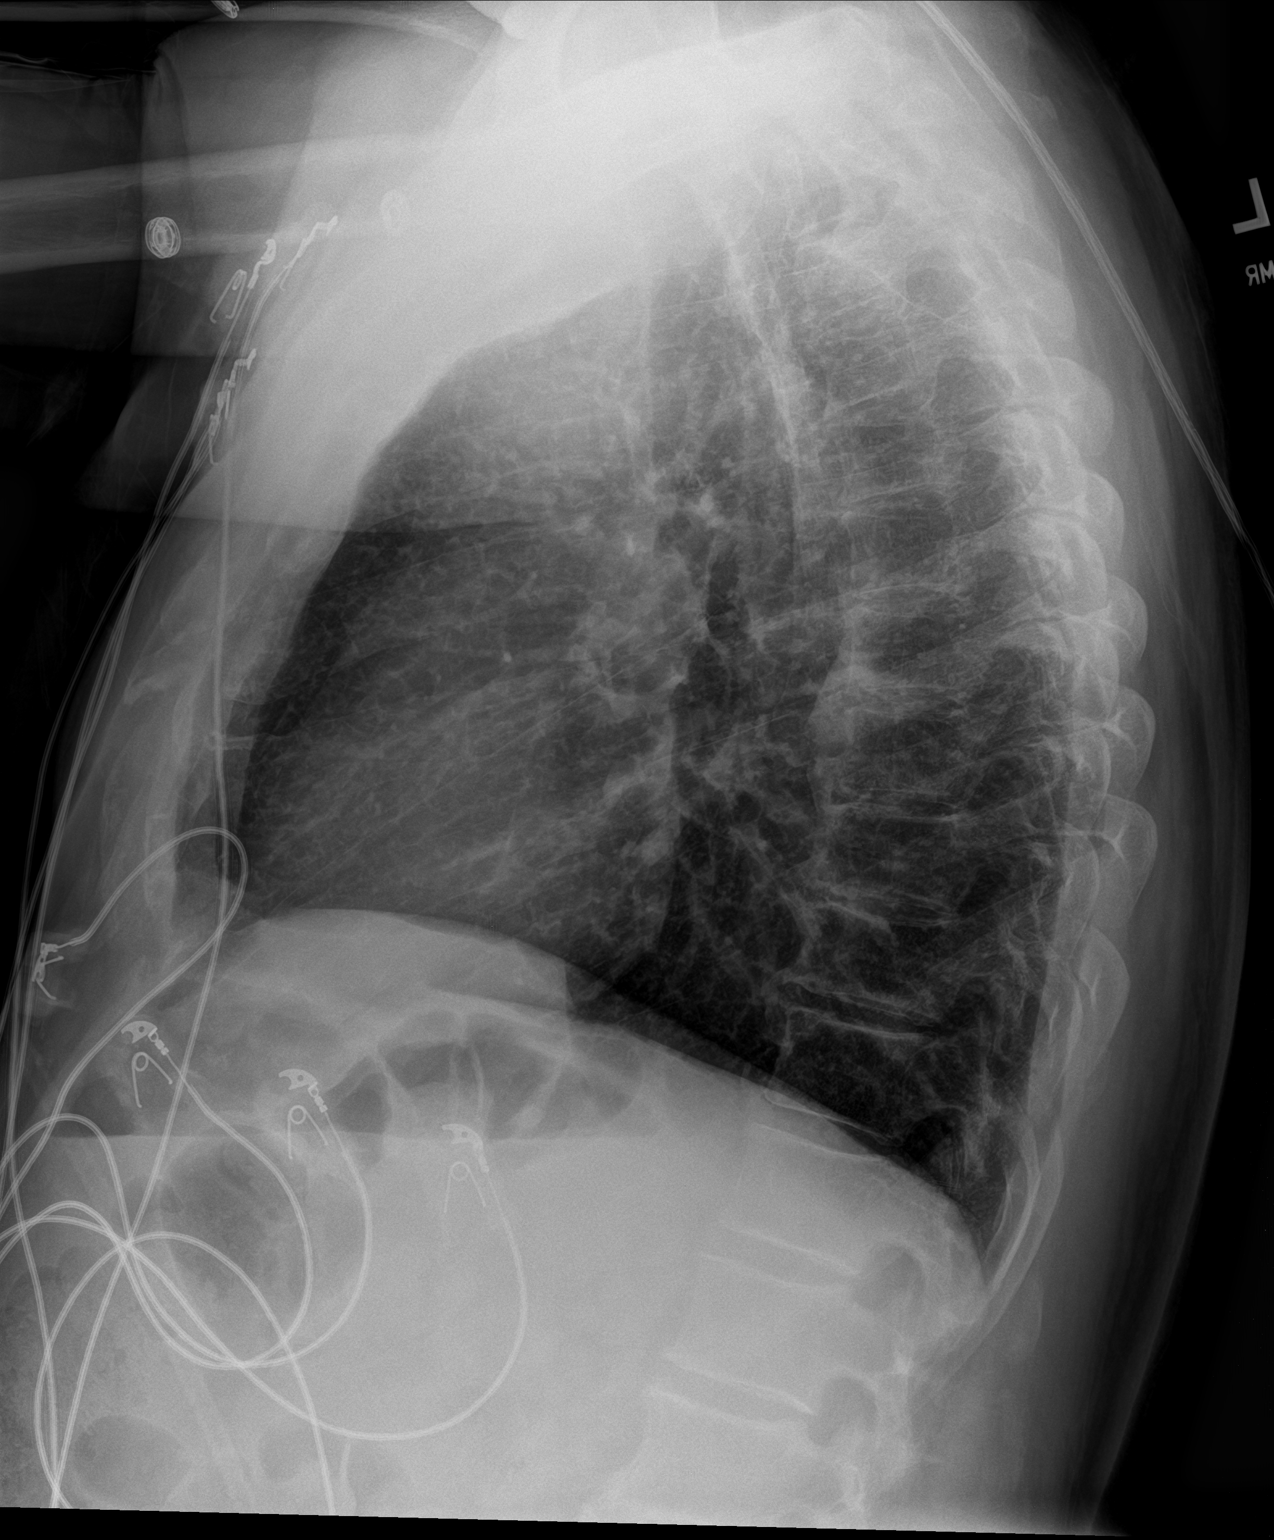

[2 of 2 positions shown; findings below may reference images not displayed]

FINDINGS: The cardiomediastinal contours are normal. Mild
peribronchial/interstitial thickening. Pulmonary vasculature is
normal. No consolidation, pleural effusion, or pneumothorax. No
acute osseous abnormalities are seen.
IMPRESSION: Mild peribronchial/interstitial thickening, can be seen with smoking
related lung disease, bronchitis or asthma.

## 2019-10-11 IMAGING — MR MR HEAD W/O CM
8 of 10 series · 35 of 48 positions shown · non-contrast
Comparison: Head CT earlier today.

CLINICAL DATA: 59-year-old male with episodes of blurred vision,
numbness and tingling.

EXAM:
MRI HEAD WITHOUT CONTRAST
TECHNIQUE: Multiplanar, multiecho pulse sequences of the brain and surrounding
structures were obtained without intravenous contrast.

[Series 2: T1 · sagittal · 5.0mm · 0.44mm/px · 2 of 23 slices shown (1 of 2)]
[im 1/23]
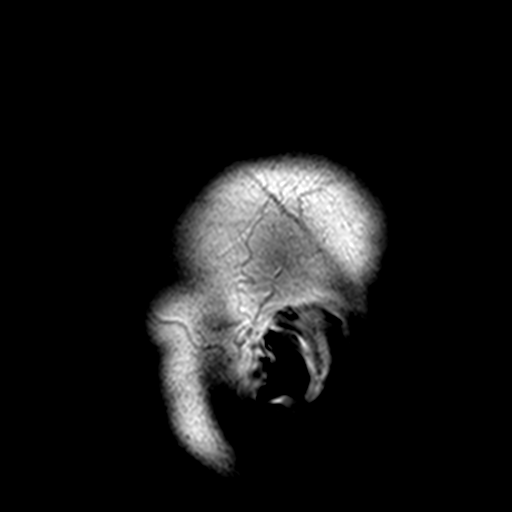
[im 23/23]
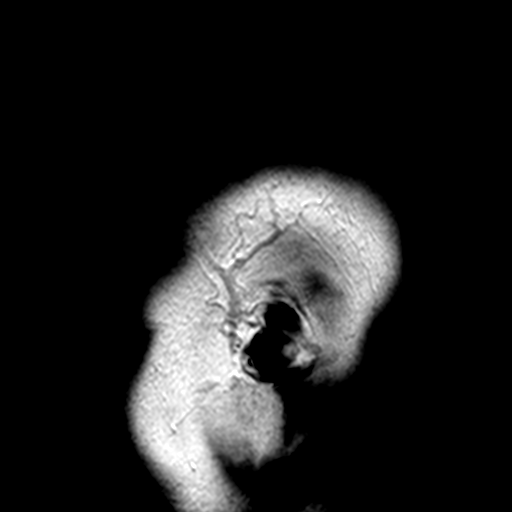

[Series 3: DWI · axial · 3.0mm · 0.78mm/px · z∈[-50,+106]mm · 8 of 106 slices shown (1 of 2)]
[im 1/106]
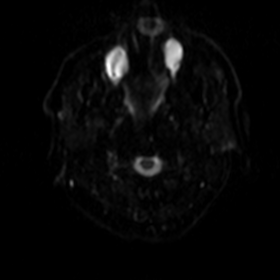
[im 12/106]
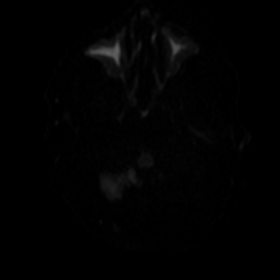
[im 36/106]
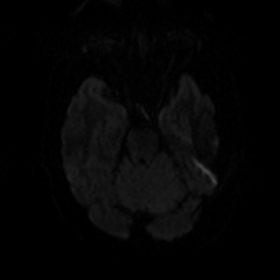
[im 47/106]
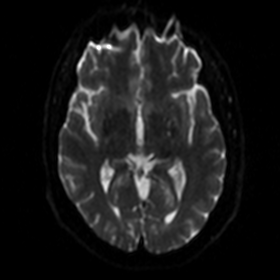
[im 59/106]
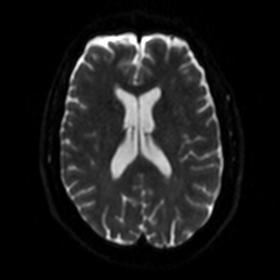
[im 71/106]
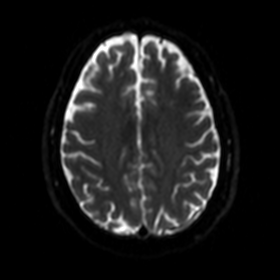
[im 94/106]
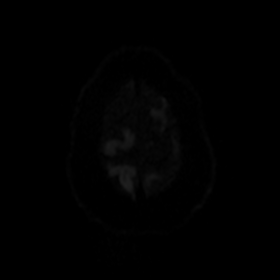
[im 106/106]
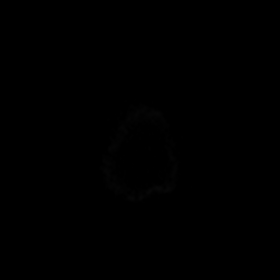

[Series 5: DWI · coronal · 5.0mm · 0.54mm/px · 7 of 70 slices shown (2 of 2)]
[im 1/70]
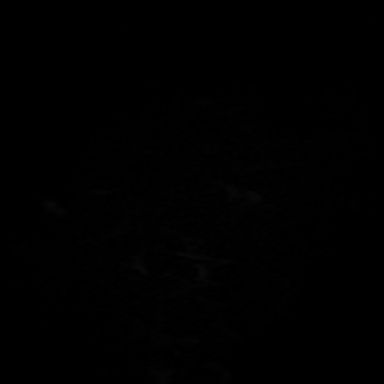
[im 12/70]
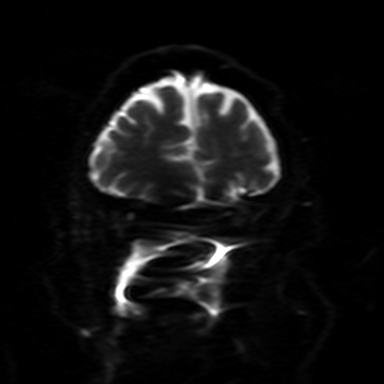
[im 24/70]
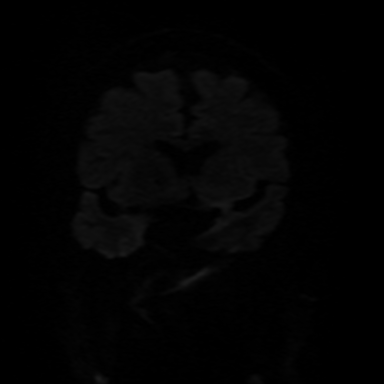
[im 35/70]
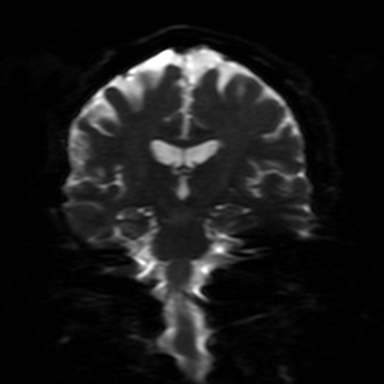
[im 47/70]
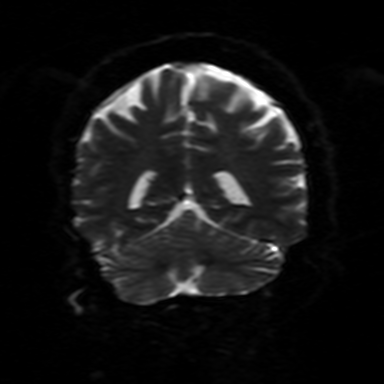
[im 58/70]
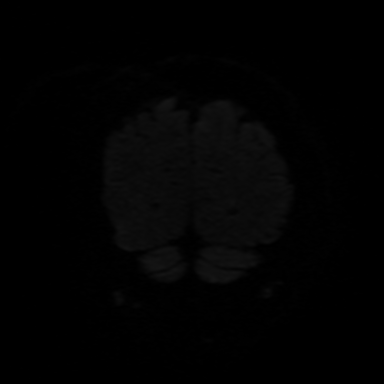
[im 70/70]
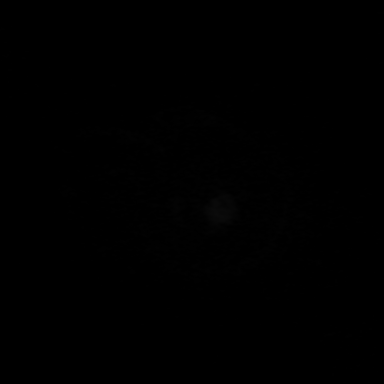

[Series 7: T2 · axial · 5.0mm · 0.70mm/px · z∈[-50,+106]mm · 2 of 25 slices shown (1 of 3)]
[im 1/25]
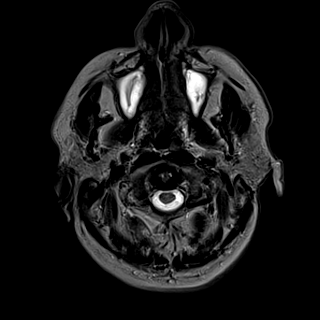
[im 25/25]
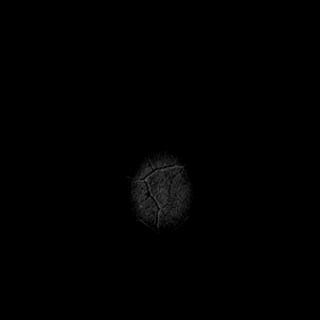

[Series 8: FLAIR · axial · 3.0mm · 0.89mm/px · z∈[-41,+97]mm · 5 of 47 slices shown]
[im 1/47]
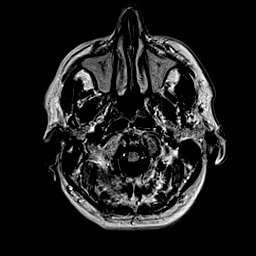
[im 12/47]
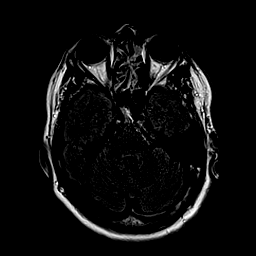
[im 24/47]
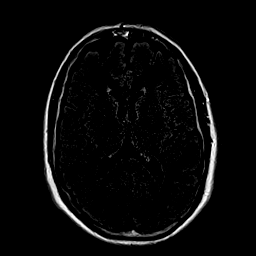
[im 35/47]
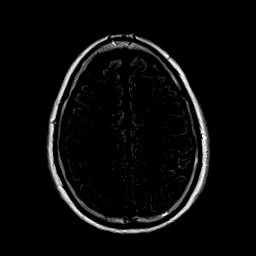
[im 47/47]
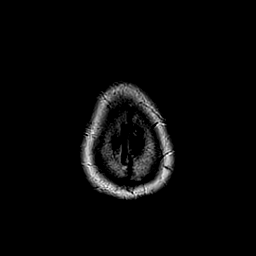

[Series 9: T2 · axial · 5.0mm · 0.43mm/px · z∈[-44,+99]mm · 2 of 23 slices shown (2 of 3)]
[im 1/23]
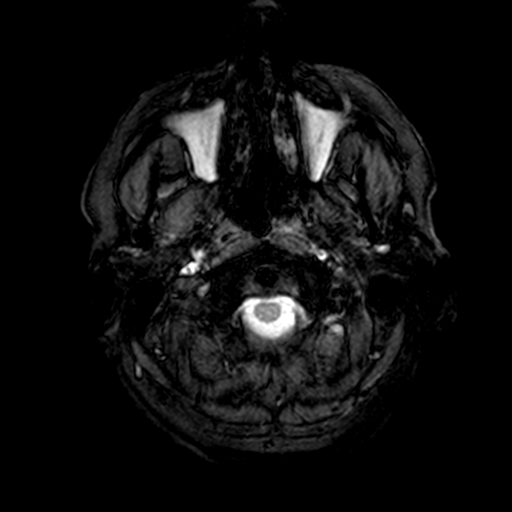
[im 23/23]
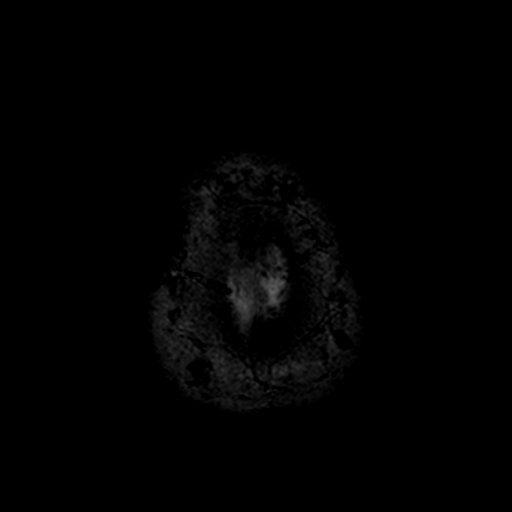

[Series 10: T1 · axial · 2.0mm · 0.44mm/px · z∈[-48,+60]mm · 6 of 77 slices shown (2 of 2)]
[im 1/77]
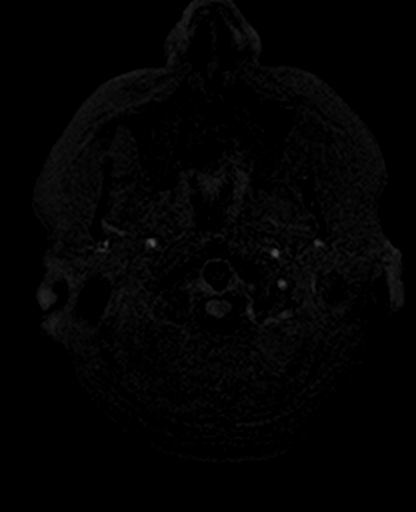
[im 11/77]
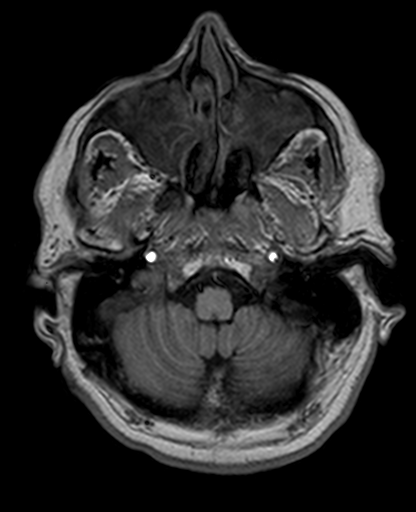
[im 22/77]
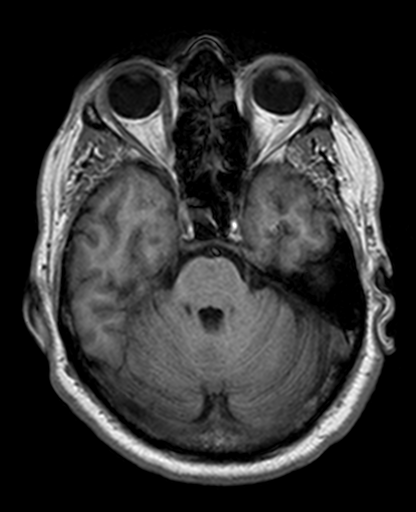
[im 33/77]
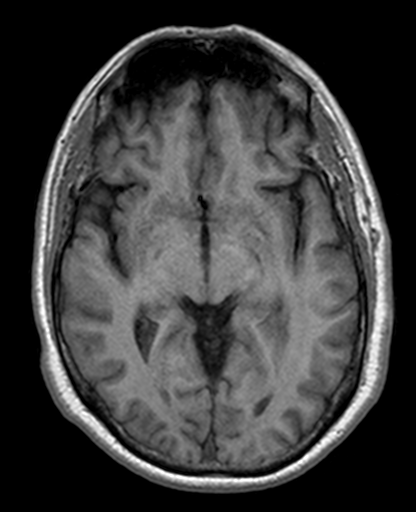
[im 44/77]
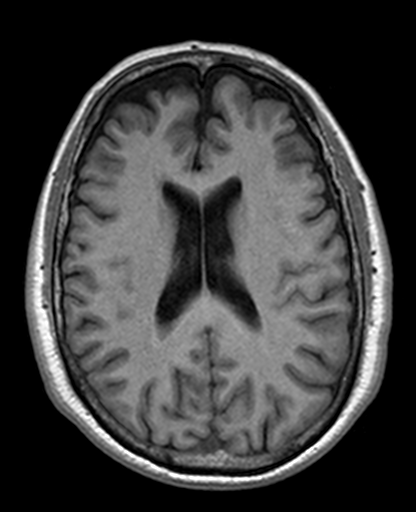
[im 55/77]
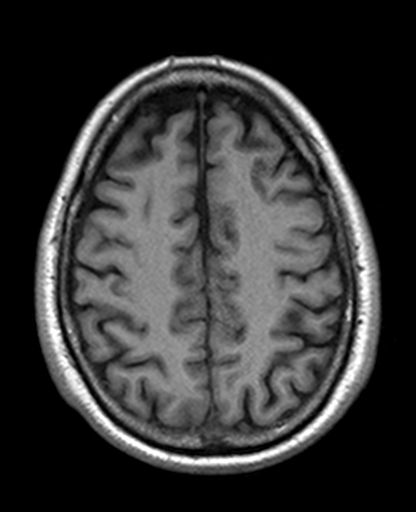

[Series 11: T2 · coronal · 5.0mm · 0.63mm/px · 3 of 28 slices shown (3 of 3)]
[im 1/28]
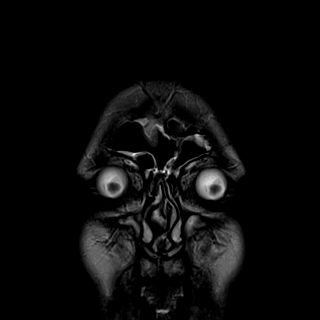
[im 14/28]
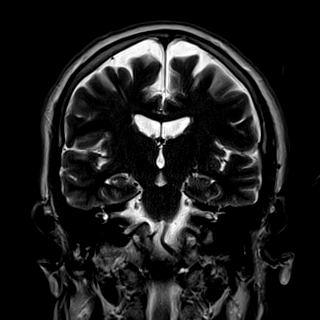
[im 28/28]
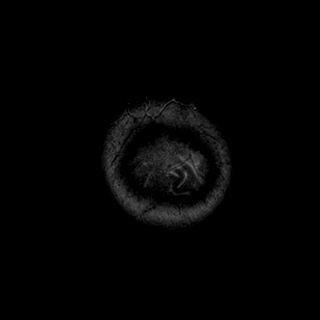

[35 of 48 positions shown; findings below may reference images not displayed]

FINDINGS: Brain: No restricted diffusion to suggest acute infarction. No
midline shift, mass effect, evidence of mass lesion,
ventriculomegaly, extra-axial collection or acute intracranial
hemorrhage. Cervicomedullary junction and pituitary are within
normal limits.

Gray and white matter signal is within normal limits for age
throughout the brain. No cortical encephalomalacia or chronic
cerebral blood products identified.

Vascular: Major intracranial vascular flow voids are preserved.

Skull and upper cervical spine: Negative visible cervical spine,
bone marrow signal.

Sinuses/Orbits: Extensive bilateral maxillary sinus disease which
appears to be a combination of bulky mucosal thickening and
trap/inspissated secretions. Mild to moderate paranasal sinus
mucosal thickening elsewhere, predominantly the hyperplastic right
sphenoid sinus.

Leftward nasal septal deviation. Bilateral OMCs are affected but
nasal cavity otherwise largely spared. Orbits soft tissues appear to
remain normal.

Other: Mastoid air cells are clear. Visible internal auditory
structures appear normal. Scalp and face appear negative.
IMPRESSION: 1. Normal for age noncontrast MRI appearance of the brain.

2. Paranasal sinus disease, with pronounced bilateral OMC
obstructive pattern of disease. Although no complicating features
identified.

## 2019-10-11 IMAGING — CT CT HEAD W/O CM
3 series · 16 of 47 positions shown, 19 images · non-contrast
Comparison: None.

CLINICAL DATA: 2 episodes of blurry vision and numbness and
tingling in both legs this morning.

EXAM:
CT HEAD WITHOUT CONTRAST
TECHNIQUE: Contiguous axial images were obtained from the base of the skull
through the vertex without intravenous contrast.

[Series 2: head w o · axial · 0.45mm/px · z∈[+152,+287]mm · 10 of 33 slices shown, 13 images]
[im 3/33  brain]
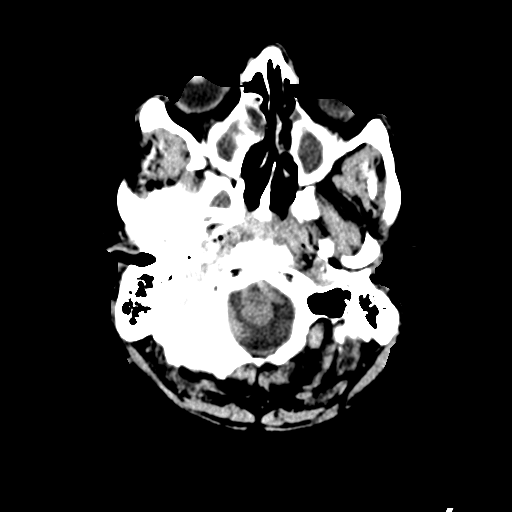
[im 3/33  bone]
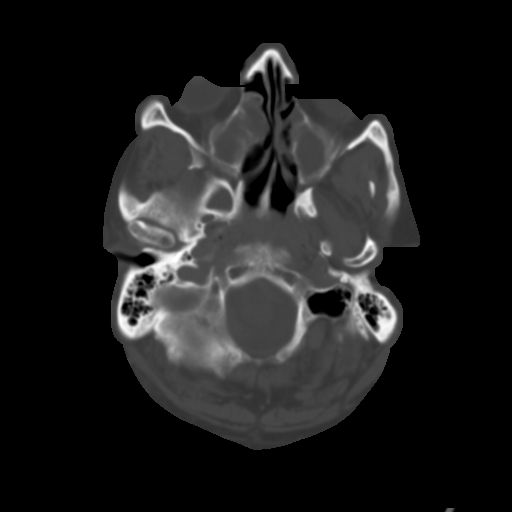
[im 6/33  brain]
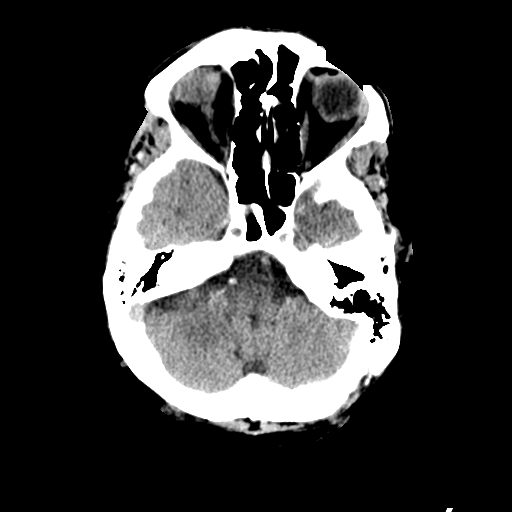
[im 9/33  brain]
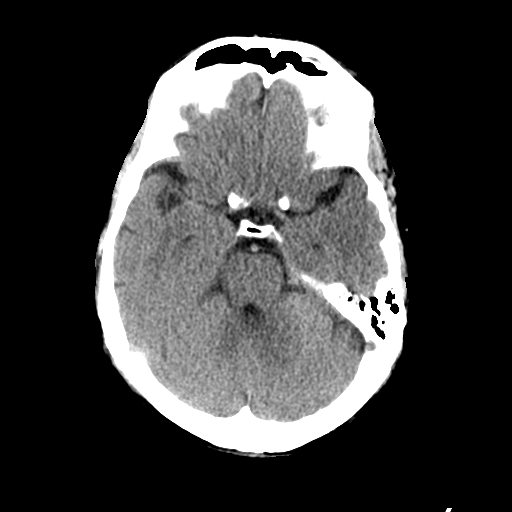
[im 12/33  brain]
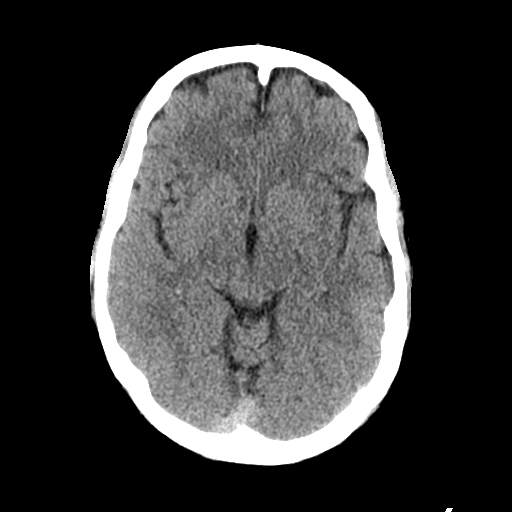
[im 15/33  brain]
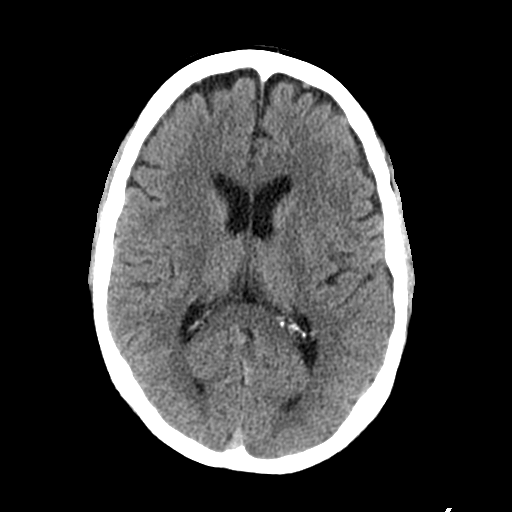
[im 15/33  bone]
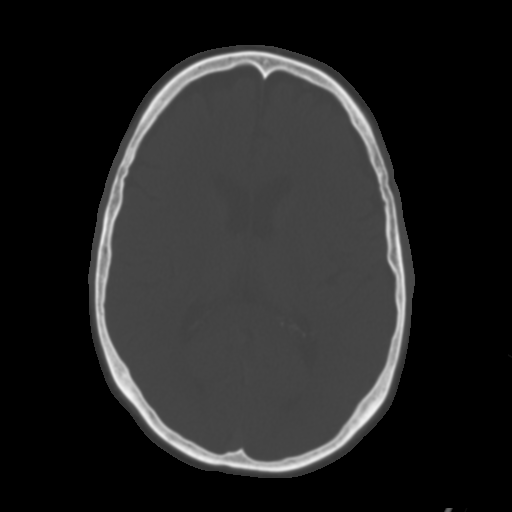
[im 18/33  brain]
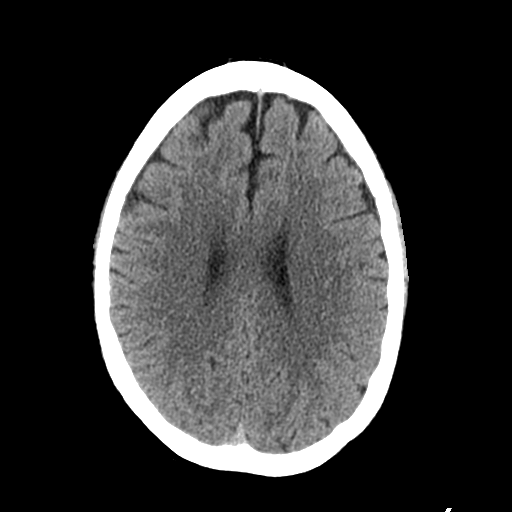
[im 21/33  brain]
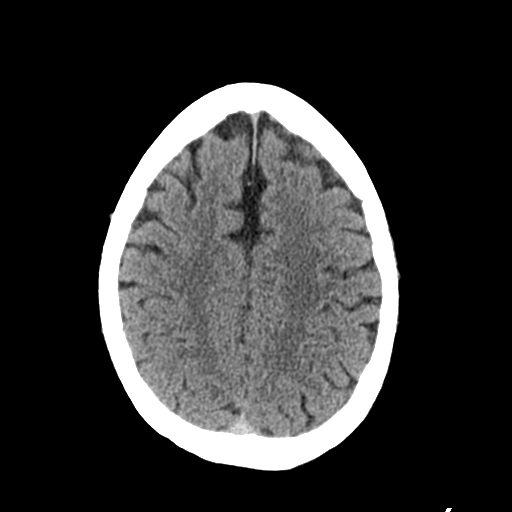
[im 25/33  brain]
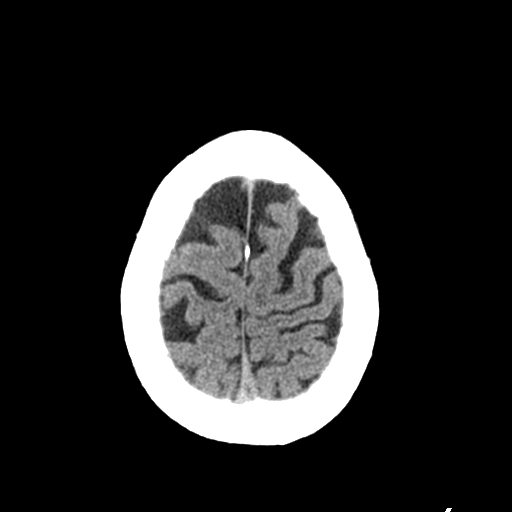
[im 27/33  brain]
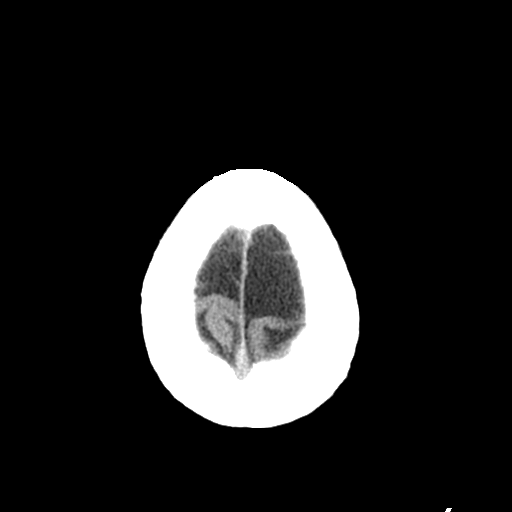
[im 27/33  bone]
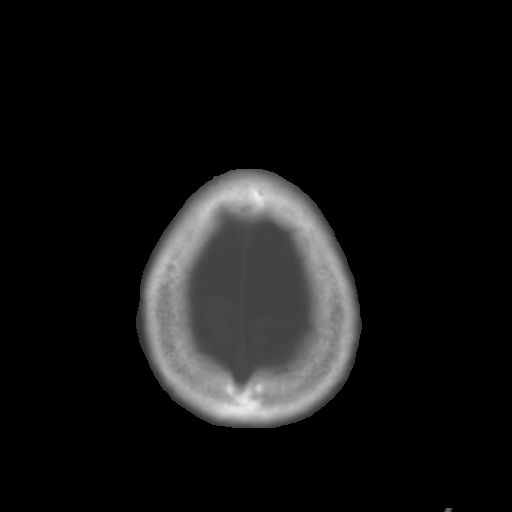
[im 30/33  brain]
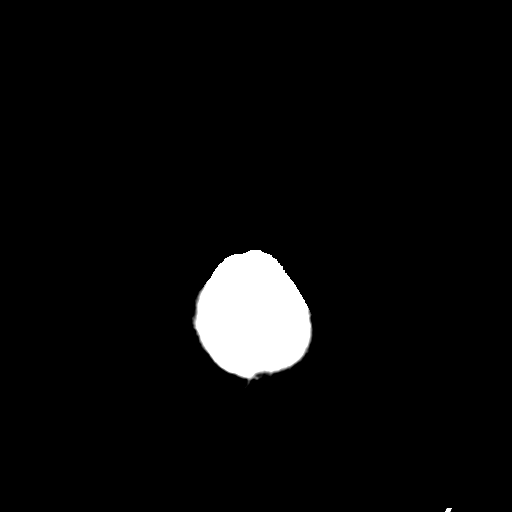

[Series 4: coronal soft · coronal · 0.32mm/px · 3 of 68 slices shown]
[im 23/68  brain]
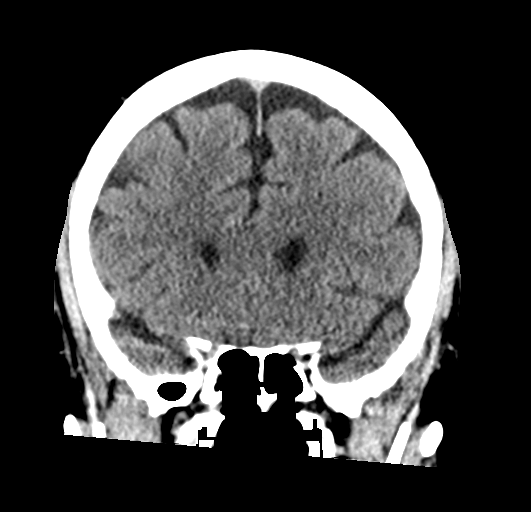
[im 30/68  brain]
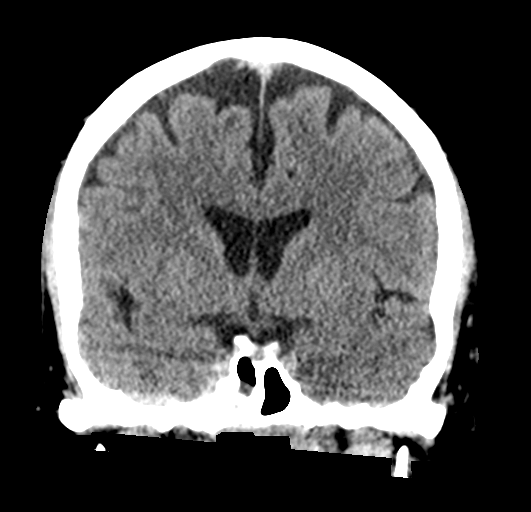
[im 38/68  brain]
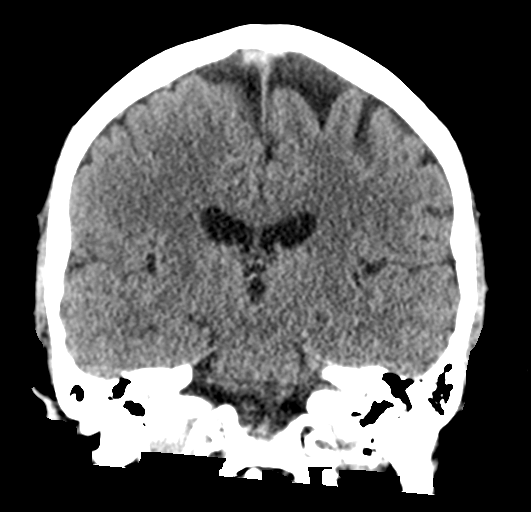

[Series 5: sagittal soft · sagittal · 0.32mm/px · 3 of 58 slices shown]
[im 20/58  brain]
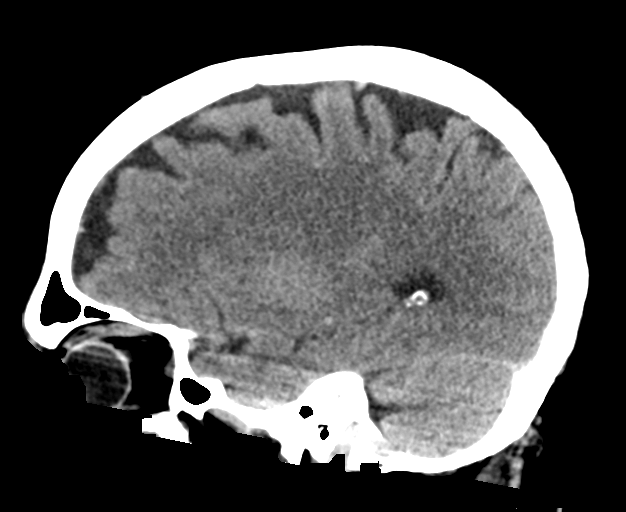
[im 29/58  brain]
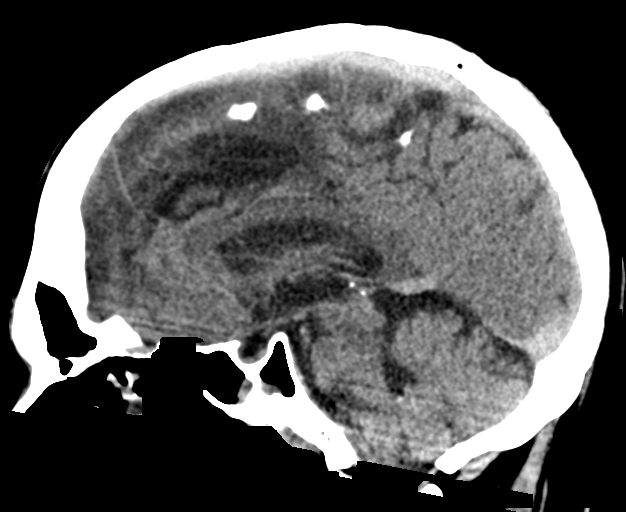
[im 39/58  brain]
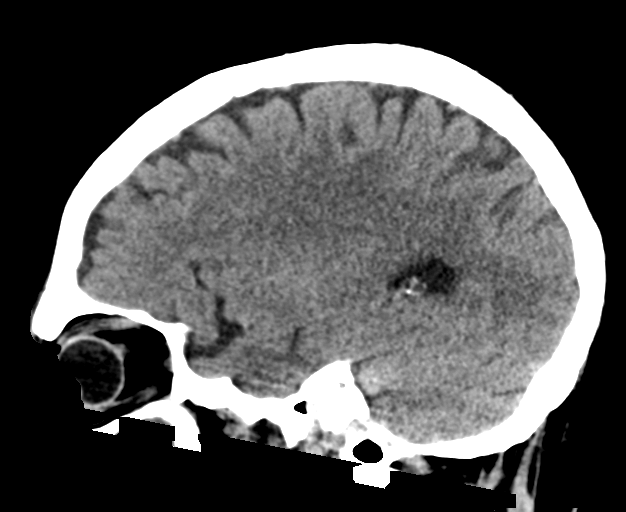

[16 of 47 positions shown; findings below may reference images not displayed]

FINDINGS: Brain: No evidence of acute infarction, hemorrhage, hydrocephalus,
extra-axial collection or mass lesion/mass effect.

Vascular: No hyperdense vessel or unexpected calcification.

Skull: Intact.  No focal lesion.

Sinuses/Orbits: The maxillary sinuses are completely opacified.
Scattered ethmoid air cell disease and mucosal thickening in the
right sphenoid sinus noted. There is also some mucosal thickening in
the right frontal sinus.

Other: None.
IMPRESSION: No acute intracranial abnormality.

Sinus disease.  The maxillary sinuses are completely opacified.

## 2019-10-11 MED ORDER — STROKE: EARLY STAGES OF RECOVERY BOOK
Freq: Once | Status: DC
  Filled 2019-10-11: qty 1

## 2019-10-11 MED ORDER — SODIUM CHLORIDE 0.9 % IV SOLN: INTRAVENOUS | Status: AC

## 2019-10-11 MED ORDER — SENNOSIDES-DOCUSATE SODIUM 8.6-50 MG PO TABS
1.0000 | ORAL_TABLET | Freq: Every evening | ORAL | Status: DC | PRN
  Filled 2019-10-11: qty 1

## 2019-10-11 MED ORDER — SIMVASTATIN 20 MG PO TABS
20.0000 mg | ORAL_TABLET | Freq: Every day | ORAL | Status: DC
  Administered 2019-10-11: 20 mg via ORAL
  Filled 2019-10-11: qty 1

## 2019-10-11 MED ORDER — GADOBUTROL 1 MMOL/ML IV SOLN
9.0000 mL | Freq: Once | INTRAVENOUS | Status: AC | PRN
  Administered 2019-10-11: 9 mL via INTRAVENOUS

## 2019-10-11 MED ORDER — ACETAMINOPHEN 160 MG/5ML PO SOLN: 650.0000 mg | ORAL | Status: DC | PRN

## 2019-10-11 MED ORDER — ACETAMINOPHEN 325 MG PO TABS: 650.0000 mg | ORAL_TABLET | ORAL | Status: DC | PRN

## 2019-10-11 MED ORDER — ACETAMINOPHEN 650 MG RE SUPP: 650.0000 mg | RECTAL | Status: DC | PRN

## 2019-10-11 MED ORDER — LISINOPRIL 10 MG PO TABS: 20.0000 mg | ORAL_TABLET | Freq: Every morning | ORAL | Status: DC

## 2019-10-11 MED ORDER — ASPIRIN EC 81 MG PO TBEC
81.0000 mg | DELAYED_RELEASE_TABLET | Freq: Every day | ORAL | Status: DC
  Administered 2019-10-12: 81 mg via ORAL
  Filled 2019-10-11: qty 1

## 2019-10-11 NOTE — ED Provider Notes (Signed)
HeThis patient is a 59 year old male, presents with a complaint of a combination of things including some bilateral leg heaviness as well as blurry vision as well as some tingling in his left hand and his left thigh medially.  That approximately 1 week ago he had similar visual changes when he was at the [employer], he became very dizzy, drove himself home and it gradually improved.  This occurred again today.  He has no f/c/n/v/d.  He is back to his normal self at this time - no lower back pain.  CT head  teleneurological evaluation  I discussed the care with the teleneurologist who recommends that the patient be admitted for stroke work-up, also needs an MRI of the lumbar spine to rule out radicular cause of the patient's leg heaviness.  That being said at this time the patient is essentially symptom-free and doing well  Medical screening examination/treatment/procedure(s) were conducted as a shared visit with non-physician practitioner(s) and myself.  I personally evaluated the patient during the encounter.  Clinical Impression:   Final diagnoses:  Dizziness  Paresthesia         Noemi Chapel, MD 10/12/19 [phone]

## 2019-10-11 NOTE — ED Provider Notes (Signed)
[facility] EMERGENCY DEPARTMENT Provider Note   CSN: [id number] Arrival date & time: 10/11/19  1126     History Chief Complaint  Patient presents with  . Dizziness    Scott Robbins is a 59 y.o. male.  59 year old male with past medical history of borderline diabetes, hypertension, hyperlipidemia presents with complaint of feeling unwell while at work today.  Patient states that he took a break around 930 this morning, ate a snack and had a cup of coffee and while he was walking to the bathroom he felt like his legs weighed 100 pounds each, after leaving the bathroom he felt "swimmy headed" patient went back to his tractor to get into the air conditioning and cool off, then felt like he had numbness in his medial left leg and palm of his left hand.  Patient called his boss and told he was unable to continue working, boss came and picked him up and told him he did not look well.  Patient states he felt sweaty at times, did have a sharp pain in the center of his chest that was brief.  Denies feeling short of breath, denies any extremity weakness.  Patient was going to drive home however had return of numb sensation in his left thigh and left palm which prompted him to come to the ER today for evaluation.  Patient denies any complaints at this time.        Past Medical History:  Diagnosis Date  . Hypertension     Patient Active Problem List   Diagnosis Date Noted  . TIA (transient ischemic attack) 10/11/2019    Past Surgical History:  Procedure Laterality Date  . APPENDECTOMY    . COLONOSCOPY N/A 02/26/2017   Procedure: COLONOSCOPY;  Surgeon: Daneil Dolin, MD;  Location: [facility];  Service: Endoscopy;  Laterality: N/A;  1:15 pm  . POLYPECTOMY  02/26/2017   Procedure: POLYPECTOMY;  Surgeon: Daneil Dolin, MD;  Location: [facility];  Service: Endoscopy;;  descending       Family History  Problem Relation Age of Onset  . Colon cancer Neg Hx     Social History    Tobacco Use  . Smoking status: Current Every Day Smoker    Packs/day: 0.50    Years: 25.00    Pack years: 12.50    Types: Cigarettes  . Smokeless tobacco: Never Used  Substance Use Topics  . Alcohol use: Yes    Alcohol/week: 5.0 standard drinks    Types: 5 Cans of beer per week    Comment: daily, beer  . Drug use: No    Home Medications Prior to Admission medications   Medication Sig Start Date End Date Taking? Authorizing Provider  lisinopril (ZESTRIL) 20 MG tablet Take 20 mg by mouth in the morning. 10/09/19  Yes [provider]  simvastatin (ZOCOR) 20 MG tablet Take 20 mg by mouth at bedtime. 07/06/19  Yes [provider]  vitamin B-12 (CYANOCOBALAMIN) 250 MCG tablet Take 250 mcg by mouth daily.   Yes [provider]    Allergies    Patient has no known allergies.  Review of Systems   Review of Systems  Constitutional: Positive for diaphoresis. Negative for fever.  Eyes: Negative for visual disturbance.  Respiratory: Negative for shortness of breath.   Cardiovascular: Positive for chest pain.  Gastrointestinal: Negative for abdominal pain, constipation, diarrhea, nausea and vomiting.  Genitourinary: Negative for difficulty urinating.  Musculoskeletal: Negative for arthralgias and myalgias.  Skin: Negative for rash and wound.  Neurological: Positive for light-headedness and numbness. Negative for speech difficulty and weakness.  Psychiatric/Behavioral: Negative for confusion.  All other systems reviewed and are negative.   Physical Exam Updated Vital Signs BP (!) 155/90   Pulse 83   Temp 98 F (36.7 C) (Oral)   Resp 11   Ht 6' (1.829 m)   Wt 94.3 kg   SpO2 97%   BMI 28.21 kg/m   Physical Exam Vitals and nursing note reviewed.  Constitutional:      General: He is not in acute distress.    Appearance: He is well-developed. He is not diaphoretic.  HENT:     Head: Normocephalic and atraumatic.     Mouth/Throat:     Mouth: Mucous  membranes are dry.  Eyes:     Extraocular Movements: Extraocular movements intact.     Pupils: Pupils are equal, round, and reactive to light.  Cardiovascular:     Rate and Rhythm: Normal rate and regular rhythm.     Pulses: Normal pulses.     Heart sounds: Normal heart sounds.  Pulmonary:     Effort: Pulmonary effort is normal.     Breath sounds: Normal breath sounds.  Abdominal:     Palpations: Abdomen is soft.     Tenderness: There is no abdominal tenderness.  Musculoskeletal:        General: No tenderness.     Right lower leg: No edema.     Left lower leg: No edema.  Skin:    General: Skin is warm and dry.     Findings: No erythema or rash.  Neurological:     Mental Status: He is alert and oriented to person, place, and time.     Cranial Nerves: No cranial nerve deficit.     Sensory: No sensory deficit.     Motor: No weakness.     Coordination: Coordination normal.     Gait: Gait normal.     Deep Tendon Reflexes: Reflexes normal.  Psychiatric:        Behavior: Behavior normal.     ED Results / Procedures / Treatments   Labs (all labs ordered are listed, but only abnormal results are displayed) Labs Reviewed  BASIC METABOLIC PANEL - Abnormal; Notable for the following components:      Result Value   Glucose, Bld 148 (*)    All other components within normal limits  SARS CORONAVIRUS 2 BY RT PCR (HOSPITAL ORDER, [facility])  CBC  PROTIME-INR  TROPONIN I (HIGH SENSITIVITY)  TROPONIN I (HIGH SENSITIVITY)    EKG EKG Interpretation  Date/Time:  Wednesday October 11 2019 12:34:03 EDT Ventricular Rate:  81 PR Interval:    QRS Duration: 154 QT Interval:  407 QTC Calculation: 473 R Axis:   62 Text Interpretation: Sinus rhythm Right bundle branch block Since last tracing Right bundle branch block NOW PRESENT Confirmed by Noemi Chapel [phone]) on 10/11/2019 3:17:36 PM   Radiology DG Chest 2 View  Result Date: 10/11/2019 CLINICAL DATA:   Chest pain. Patient reports blurry vision. EXAM: CHEST - 2 VIEW COMPARISON:  None. FINDINGS: The cardiomediastinal contours are normal. Mild peribronchial/interstitial thickening. Pulmonary vasculature is normal. No consolidation, pleural effusion, or pneumothorax. No acute osseous abnormalities are seen. IMPRESSION: Mild peribronchial/interstitial thickening, can be seen with smoking related lung disease, bronchitis or asthma. Electronically Signed   By: Keith Rake M.D.   On: 10/11/2019 13:42   CT Head Wo  Contrast  Result Date: 10/11/2019 CLINICAL DATA:  2 episodes of blurry vision and numbness and tingling in both legs this morning. EXAM: CT HEAD WITHOUT CONTRAST TECHNIQUE: Contiguous axial images were obtained from the base of the skull through the vertex without intravenous contrast. COMPARISON:  None. FINDINGS: Brain: No evidence of acute infarction, hemorrhage, hydrocephalus, extra-axial collection or mass lesion/mass effect. Vascular: No hyperdense vessel or unexpected calcification. Skull: Intact.  No focal lesion. Sinuses/Orbits: The maxillary sinuses are completely opacified. Scattered ethmoid air cell disease and mucosal thickening in the right sphenoid sinus noted. There is also some mucosal thickening in the right frontal sinus. Other: None. IMPRESSION: No acute intracranial abnormality. Sinus disease.  The maxillary sinuses are completely opacified. Electronically Signed   By: Inge Rise M.D.   On: 10/11/2019 13:30    Procedures Procedures (including critical care time)  Medications Ordered in ED Medications - No data to display  ED Course  I have reviewed the triage vital signs and the nursing notes.  Pertinent labs & imaging results that were available during my care of the patient were reviewed by me and considered in my medical decision making (see chart for details).  Clinical Course as of Oct 10 1629  Wed Jun 09, [id number]  [id number] 59 year old male presents with complaint  of bilateral leg heaviness with numbness in his left inner thigh and left hand also with mild chest discomfort today.  By time of evaluation the ER, symptoms have resolved.  Exam is unremarkable.  EKG without acute ischemic changes, troponin of 2.  His CBC and BMP without significant findings.  Head CT unremarkable.  Patient was seen by telemetry neuro who recommended MRI brain and L-spine to evaluate for radicular issue and admit to hospitalist.   [LM]  1540 Case discussed with Dr. Jamse Arn with [facility] hospitalist service who will consult for admission.   [LM]  0093 Discussed results and plan of care with patient who is agreeable with plan for admission.   [LM]    Clinical Course User Index [LM] Roque Lias   MDM Rules/Calculators/A&P                      Final Clinical Impression(s) / ED Diagnoses Final diagnoses:  Dizziness  Paresthesia    Rx / DC Orders ED Discharge Orders    None       Tacy Learn, PA-C 10/11/19 1631    Noemi Chapel, MD 10/12/19 [phone]

## 2019-10-11 NOTE — ED Triage Notes (Signed)
Patient went on break at work around 930 am, started having blurred vision, numbing and tingling in both legs, had another episode around 11 am.

## 2019-10-11 NOTE — Consult Note (Signed)
[facility] TeleNeurology Consult Services  Stat Consult  Date of Service:   10/11/2019 13:22:23  Impression:     .  G45.9 - Transient cerebral ischemic attack, unspecified  Comments/Sign-Out: Pt is a 75 YOM who presented with a couple of episodes of blurred vision, leg weakness/numbness/tingling, dizzy feeling, left palm numbness/tingling. NIHSS: 0. Deferred CTA/TPA. Admit for TIA workup.  CT HEAD: Showed No Acute Hemorrhage or Acute Core Infarct.  Metrics: [facility] Notification Time: 10/11/2019 13:20:29 Stamp Time: 10/11/2019 13:22:23 Callback Response Time: 10/11/2019 13:23:49  Our recommendations are outlined below.  Recommendations:     .  Initiate Aspirin 81 MG Daily     .  Goal SBP b/w 100-140.     Marland Kitchen  Start ASA + STATIN if no contraindications.     .  Get MRI LUMBAR W/O     .  Get ESR/CRP, TROP, CK, BNP, UDS, TSH, B12, B1, LACTIC ACID, LIPID PANEL, and A1C.  Imaging Studies:     .  MRI Head Without Contrast     .  MRA Head Without Contrast     .  Carotid Dopplers     .  Echocardiogram - Transthoracic Echocardiogram  Therapies:     .  Physical Therapy, Occupational Therapy, Speech Therapy Assessment When Applicable  Other WorkUp:     .  Infectious/metabolic workup per primary team     .  Check an ammonia level     .  Check B12 level     .  Check TSH  Disposition: Neurology Follow Up Recommended  Sign Out:     .  Discussed with Emergency Department Provider  ----------------------------------------------------------------------------------------------------  Chief Complaint: blurred vision, leg weakness/numbness/tingling b/l, dizzy feeling, left palm numbness/tingling  History of Present Illness: Patient is a 59 year old Male.  Pt is a 43 YOM with PMH of HTN, HLD who presented with complaint of blurred vision, leg weakness/numbness/tingling b/l, dizzy feeling, left palm numbness/tingling episodes. He had a couple of episodes  today, 1st one around 0930, and 2nd one around 1100. He is a former smoker and drinker, not currently. He takes BP and HLD meds, no ASA. He denied any double vision, slurred speech, headache, incoordination, no BI/UI, no saddle anesthesia, no radicular symptoms, no neck or back pain.   Past Medical History:     . Hypertension     . There is NO history of Diabetes Mellitus     . There is NO history of Hyperlipidemia     . There is NO history of Atrial Fibrillation     . There is NO history of Coronary Artery Disease     . There is NO history of Stroke  Anticoagulant use:  No  Antiplatelet use: No    Examination: BP(155/90), Pulse(83), Blood Glucose(148) 1A: Level of Consciousness - Alert; keenly responsive + 0 1B: Ask Month and Age - Both Questions Right + 0 1C: Blink Eyes & Squeeze Hands - Performs Both Tasks + 0 2: Test Horizontal Extraocular Movements - Normal + 0 3: Test Visual Fields - No Visual Loss + 0 4: Test Facial Palsy (Use Grimace if Obtunded) - Normal symmetry + 0 5A: Test Left Arm Motor Drift - No Drift for 10 Seconds + 0 5B: Test Right Arm Motor Drift - No Drift for 10 Seconds + 0 6A: Test Left Leg Motor Drift - No Drift for 5 Seconds + 0 6B: Test Right Leg Motor Drift - No Drift for 5 Seconds + 0  7: Test Limb Ataxia (FNF/Heel-Shin) - No Ataxia + 0 8: Test Sensation - Normal; No sensory loss + 0 9: Test Language/Aphasia - Normal; No aphasia + 0 10: Test Dysarthria - Normal + 0 11: Test Extinction/Inattention - No abnormality + 0  NIHSS Score: 0   Patient/Family was informed the Neurology Consult would occur via TeleHealth consult by way of interactive audio and video telecommunications and consented to receiving care in this manner.  Patient is being evaluated for possible acute neurologic impairment and high probability of imminent or life-threatening deterioration. I spent total of 40 minutes providing care to this patient, including time for face to face visit  via telemedicine, review of medical records, imaging studies and discussion of findings with providers, the patient and/or family.   Dr Currie Paris   [facility] [phone]  Case [id number]

## 2019-10-11 NOTE — H&P (Addendum)
History and Physical:    Scott Robbins   VZD:[id number] DOB: 1960-10-19 DOA: 10/11/2019  Referring MD/provider: PA Percell Miller PCP: Celene Squibb, MD   Patient coming from: Home  Chief Complaint: "My legs felt heavy and I was dizzy".  History of Present Illness:   Scott Robbins is an 59 y.o. male with PMH significant for HTN and dyslipidemia who was in his usual state of health until earlier this morning when he felt "my legs felt heavy and I felt dizzy "as he was walking back to his truck.  Patient got into his truck and turned the air conditioning on and thought he felt better however subsequently developed numb feeling in his inner left thigh as well as his left hand, palmar aspect.  Patient notes the symptoms lasted 5 minutes and then resolved.  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  Patient went to see his boss who said "your color is way off" so he came to the ED.  By the time the patient came to the ED he felt to be back at baseline.  Of note patient had similar symptoms of heavy [place] feeling of his legs associated with dizziness 2 weeks ago.  These resolved spontaneously without intervention.  Patient denies any fevers or chills.  No headache or photophobia.  Denies any recent change in baseline back pain.  No back or head injuries or injuries of any sort.  Patient denies being dehydrated, has not been working out in the heat, notes his cab of his truck has air conditioning and he drinks a lot of water.  No new medication changes lately.  No nausea or vomiting or abdominal pain.  No chest pain or palpitations.  No change in the size of his legs, no new lower extremity edema.  No pleuritic chest pain.  No new cough.  ED Course:  The patient was noted to be afebrile.  Patient was seen by teleneurology who recommended admission for TIA work-up as well as MRA of the head and MR of lumbar  spine.  ROS:   ROS   Review of Systems: As per HPI  Past Medical History:   Past Medical History:  Diagnosis Date  . Hypertension     Past Surgical History:   Past Surgical History:  Procedure Laterality Date  . APPENDECTOMY    . COLONOSCOPY N/A 02/26/2017   Procedure: COLONOSCOPY;  Surgeon: Daneil Dolin, MD;  [facility];  Service: Endoscopy;  Laterality: N/A;  1:15 pm  . POLYPECTOMY  02/26/2017   Procedure: POLYPECTOMY;  Surgeon: Daneil Dolin, MD;  [facility];  Service: Endoscopy;;  descending    Social History:   Social History   Socioeconomic History  . Marital status: Married    Spouse name: Not on file  . Number of children: Not on file  . Years of education: Not on file  . Highest education level: Not on file  Occupational History  . Not on file  Tobacco Use  . Smoking status: Current Every Day Smoker    Packs/day: 0.50    Years: 25.00    Pack years: 12.50    Types: Cigarettes  . Smokeless tobacco: Never Used  Substance and Sexual Activity  . Alcohol use: Yes    Alcohol/week: 5.0 standard drinks    Types: 5 Cans of beer per week  Comment: daily, beer  . Drug use: No  . Sexual activity: Not on file  Other Topics Concern  . Not on file  Social History Narrative  . Not on file   Social Determinants of Health   Financial Resource Strain:   . Difficulty of Paying Living Expenses:   Food Insecurity:   . Worried About Charity fundraiser in the Last Year:   . Arboriculturist in the Last Year:   Transportation Needs:   . Film/video editor (Medical):   Marland Kitchen Lack of Transportation (Non-Medical):   Physical Activity:   . Days of Exercise per Week:   . Minutes of Exercise per Session:   Stress:   . Feeling of Stress :   Social Connections:   . Frequency of Communication with Friends and Family:   . Frequency of Social Gatherings with Friends and Family:   . Attends Religious Services:   . Active Member of Clubs  or Organizations:   . Attends Archivist Meetings:   Marland Kitchen Marital Status:   Intimate Partner Violence:   . Fear of Current or Ex-Partner:   . Emotionally Abused:   Marland Kitchen Physically Abused:   . Sexually Abused:     Allergies   Patient has no known allergies.  Family history:   Family History  Problem Relation Age of Onset  . Colon cancer Neg Hx     Current Medications:   Prior to Admission medications   Medication Sig Start Date End Date Taking? Authorizing Provider  lisinopril (ZESTRIL) 20 MG tablet Take 20 mg by mouth in the morning. 10/09/19  Yes [provider]  simvastatin (ZOCOR) 20 MG tablet Take 20 mg by mouth at bedtime. 07/06/19  Yes [provider]  vitamin B-12 (CYANOCOBALAMIN) 250 MCG tablet Take 250 mcg by mouth daily.   Yes [provider]    Physical Exam:   Vitals:   10/11/19 1159 10/11/19 1200 10/11/19 1300  BP: (!) 156/106  (!) 155/90  Pulse: 84  83  Resp: 20  11  Temp: 98 F (36.7 C)    TempSrc: Oral    SpO2: 99%  97%  Weight:  94.3 kg   Height:  6' (1.829 m)      Physical Exam: Blood pressure (!) 155/90, pulse 83, temperature 98 F (36.7 C), temperature source Oral, resp. rate 11, height 6' (1.829 m), weight 94.3 kg, SpO2 97 %. Gen: Well-appearing man lying in bed talking with his wife on the phone in no acute distress. Eyes: sclera anicteric, conjuctiva mildly injected bilaterally CVS: S1-S2, regulary, no gallops Respiratory:  decreased air entry likely secondary to decreased inspiratory effort GI: NABS, soft, NT  LE: No edema. No cyanosis Neuro: A/O x 3, Moving all extremities equally with normal strength, patient appears grossly nonfocal to me however will defer more complete neuro exam to tele-neurologist. Psych: patient is logical and coherent, judgement and insight appear normal, mood and affect appropriate to situation. Skin: no rashes or lesions or ulcers,    Data Review:    Labs: Basic Metabolic  Panel: Recent Labs  Lab 10/11/19 1230  NA 137  K 3.7  CL 103  CO2 24  GLUCOSE 148*  BUN 10  CREATININE 0.83  CALCIUM 9.6   Liver Function Tests: No results for input(s): AST, ALT, ALKPHOS, BILITOT, PROT, ALBUMIN in the last 168 hours. No results for input(s): LIPASE, AMYLASE in the last 168 hours. No results for input(s): AMMONIA in the  last 168 hours. CBC: Recent Labs  Lab 10/11/19 1230  WBC 9.2  HGB 16.1  HCT 47.1  MCV 90.9  PLT 288   Cardiac Enzymes: No results for input(s): CKTOTAL, CKMB, CKMBINDEX, TROPONINI in the last 168 hours.  BNP (last 3 results) No results for input(s): PROBNP in the last 8760 hours. CBG: No results for input(s): GLUCAP in the last 168 hours.  Urinalysis No results found for: COLORURINE, APPEARANCEUR, LABSPEC, PHURINE, GLUCOSEU, HGBUR, BILIRUBINUR, KETONESUR, PROTEINUR, UROBILINOGEN, NITRITE, LEUKOCYTESUR    Radiographic Studies: DG Chest 2 View  Result Date: 10/11/2019 CLINICAL DATA:  Chest pain. Patient reports blurry vision. EXAM: CHEST - 2 VIEW COMPARISON:  None. FINDINGS: The cardiomediastinal contours are normal. Mild peribronchial/interstitial thickening. Pulmonary vasculature is normal. No consolidation, pleural effusion, or pneumothorax. No acute osseous abnormalities are seen. IMPRESSION: Mild peribronchial/interstitial thickening, can be seen with smoking related lung disease, bronchitis or asthma. Electronically Signed   By: Keith Rake M.D.   On: 10/11/2019 13:42   CT Head Wo Contrast  Result Date: 10/11/2019 CLINICAL DATA:  2 episodes of blurry vision and numbness and tingling in both legs this morning. EXAM: CT HEAD WITHOUT CONTRAST TECHNIQUE: Contiguous axial images were obtained from the base of the skull through the vertex without intravenous contrast. COMPARISON:  None. FINDINGS: Brain: No evidence of acute infarction, hemorrhage, hydrocephalus, extra-axial collection or mass lesion/mass effect. Vascular: No hyperdense  vessel or unexpected calcification. Skull: Intact.  No focal lesion. Sinuses/Orbits: The maxillary sinuses are completely opacified. Scattered ethmoid air cell disease and mucosal thickening in the right sphenoid sinus noted. There is also some mucosal thickening in the right frontal sinus. Other: None. IMPRESSION: No acute intracranial abnormality. Sinus disease.  The maxillary sinuses are completely opacified. Electronically Signed   By: Inge Rise M.D.   On: 10/11/2019 13:30    EKG: Independently reviewed.  Sinus rhythm at 80.  RBBB.   Assessment/Plan:   Principal Problem:   TIA (transient ischemic attack) Active Problems:   HTN (hypertension)   Dyslipidemia   Numbness and tingling of left leg   59 year old man with hypertension and hyperlipidemia presents with atypical symptoms of left inner thigh and left palmar numbness associated with a heavy load and feeling in his legs.  He had similar symptoms without the numbness 1-2 weeks ago.  Hand and leg numbness Patient had transient numbness of his left inner thigh and his left palm which have resolved. Patient was seen by telemetry neurology who are recommending MRI as well as MRA which I have ordered. Carotid Dopplers and echocardiogram also ordered Telemetry neurology also recommending MRI of L-spine which I have ordered.  No motor deficits noted on physical exam and lower extremities with normal reflexes. Patient was given aspirin 325 in ED, will continue aspirin 81 mg daily. Neuro consultation placed  Dizziness Unclear etiology of dizziness today as well as last week. Will check orthostatic vital signs now. Patient here is hypertensive although patient does state that he has lost some weight recently with no change in antihypertensive dosage. Also of note patient has a random blood sugar of 148, hemoglobin A1c is ordered and pending.  He may potentially be fluid down if he has had hyperglycemia which has been  undiagnosed. Symptoms presently resolved. I am giving him normal saline at 75 cc an hour for 16 hours.  HTN Continue lisinopril per home doses  Dyslipidemia Continue simvastatin 20 mg per home doses  Glucose 148 Hemoglobin A1c for the morning ordered.  Other information:   DVT prophylaxis: None ordered given outpatient status and no difficulty with ambulation. Code Status: Full Family Communication: Patient is in communication with his wife Disposition Plan: Home Consults called: Neurology for the morning Admission status: Observation  [facility]  If 7PM-7AM, please contact night-coverage [email or web address] Password [email or web address] 10/11/2019, 5:35 PM

## 2019-10-11 NOTE — ED Notes (Signed)
Pt transported to CT ?

## 2019-10-11 NOTE — ED Notes (Signed)
Attempted to call report x 1  

## 2019-10-12 ENCOUNTER — Observation Stay (HOSPITAL_BASED_OUTPATIENT_CLINIC_OR_DEPARTMENT_OTHER): Payer: PRIVATE HEALTH INSURANCE

## 2019-10-12 ENCOUNTER — Encounter (HOSPITAL_COMMUNITY): Payer: Self-pay | Admitting: Internal Medicine

## 2019-10-12 ENCOUNTER — Observation Stay (HOSPITAL_COMMUNITY): Payer: PRIVATE HEALTH INSURANCE

## 2019-10-12 DIAGNOSIS — E119 Type 2 diabetes mellitus without complications: Secondary | ICD-10-CM | POA: Diagnosis not present

## 2019-10-12 DIAGNOSIS — R200 Anesthesia of skin: Secondary | ICD-10-CM

## 2019-10-12 DIAGNOSIS — G459 Transient cerebral ischemic attack, unspecified: Secondary | ICD-10-CM | POA: Diagnosis not present

## 2019-10-12 DIAGNOSIS — E785 Hyperlipidemia, unspecified: Secondary | ICD-10-CM | POA: Diagnosis not present

## 2019-10-12 DIAGNOSIS — R202 Paresthesia of skin: Secondary | ICD-10-CM

## 2019-10-12 LAB — LIPID PANEL
Cholesterol: 163 mg/dL (ref 0–200)
HDL: 30 mg/dL — ABNORMAL LOW (ref >40)
LDL Cholesterol: 112 mg/dL — ABNORMAL HIGH (ref 0–99)
Total CHOL/HDL Ratio: 5.4 RATIO
Triglycerides: 107 mg/dL (ref <150)
VLDL: 21 mg/dL (ref 0–40)

## 2019-10-12 LAB — HEMOGLOBIN A1C
Hgb A1c MFr Bld: 6.7 % — ABNORMAL HIGH (ref 4.8–5.6)
Mean Plasma Glucose: 145.59 mg/dL

## 2019-10-12 LAB — BASIC METABOLIC PANEL
Anion gap: 7 (ref 5–15)
BUN: 12 mg/dL (ref 6–20)
CO2: 24 mmol/L (ref 22–32)
Calcium: 9 mg/dL (ref 8.9–10.3)
Chloride: 107 mmol/L (ref 98–111)
Creatinine, Ser: 0.87 mg/dL (ref 0.61–1.24)
GFR calc Af Amer: >60 mL/min (ref >60)
GFR calc non Af Amer: >60 mL/min (ref >60)
Glucose, Bld: 126 mg/dL — ABNORMAL HIGH (ref 70–99)
Potassium: 4 mmol/L (ref 3.5–5.1)
Sodium: 138 mmol/L (ref 135–145)

## 2019-10-12 LAB — CBC
HCT: 42.4 % (ref 39.0–52.0)
Hemoglobin: 14.4 g/dL (ref 13.0–17.0)
MCH: 31 pg (ref 26.0–34.0)
MCHC: 34 g/dL (ref 30.0–36.0)
MCV: 91.2 fL (ref 80.0–100.0)
Platelets: 260 10*3/uL (ref 150–400)
RBC: 4.65 MIL/uL (ref 4.22–5.81)
RDW: 13 % (ref 11.5–15.5)
WBC: 7.1 10*3/uL (ref 4.0–10.5)
nRBC: 0 % (ref 0.0–0.2)

## 2019-10-12 LAB — ECHOCARDIOGRAM COMPLETE
Height: 72 in
Weight: 3328 oz

## 2019-10-12 IMAGING — US US CAROTID DUPLEX BILAT
1 series · 13 of 24 positions shown · non-contrast
Comparison: None.

CLINICAL DATA: Vertigo, left lower extremity numbness x2 days.
Hypertension, hyperlipidemia, previous tobacco abuse

EXAM:
BILATERAL CAROTID DUPLEX ULTRASOUND
TECHNIQUE: Gray scale imaging, color Doppler and duplex ultrasound were
performed of bilateral carotid and vertebral arteries in the neck.

[Series 1: us carotid duplex bilat · 13 of 68 slices shown]
[im 1/68]
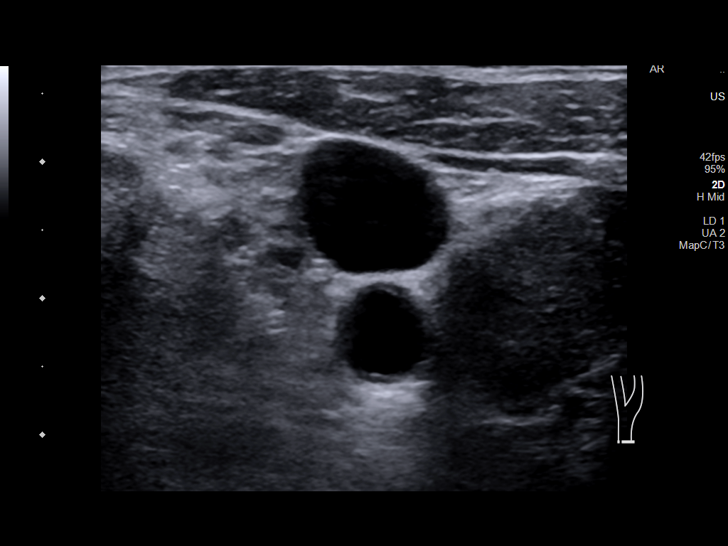
[im 6/68]
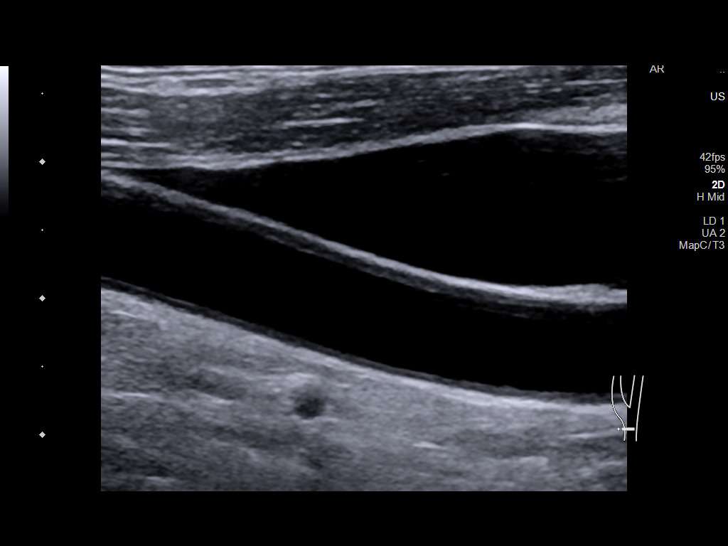
[im 12/68]
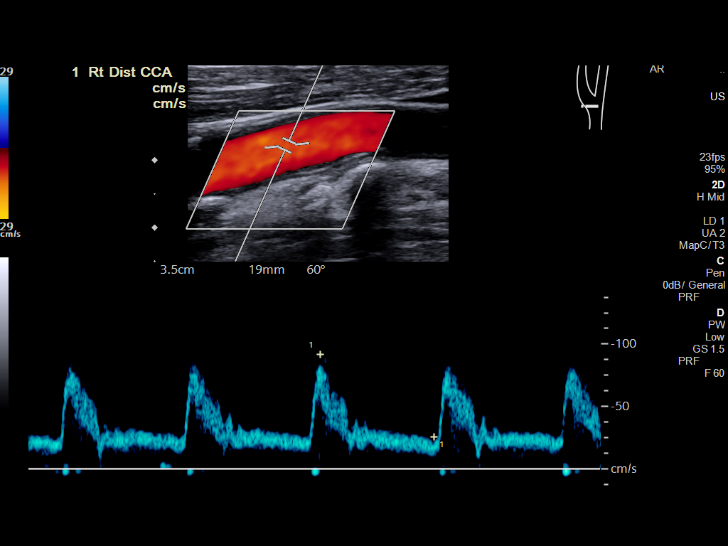
[im 18/68]
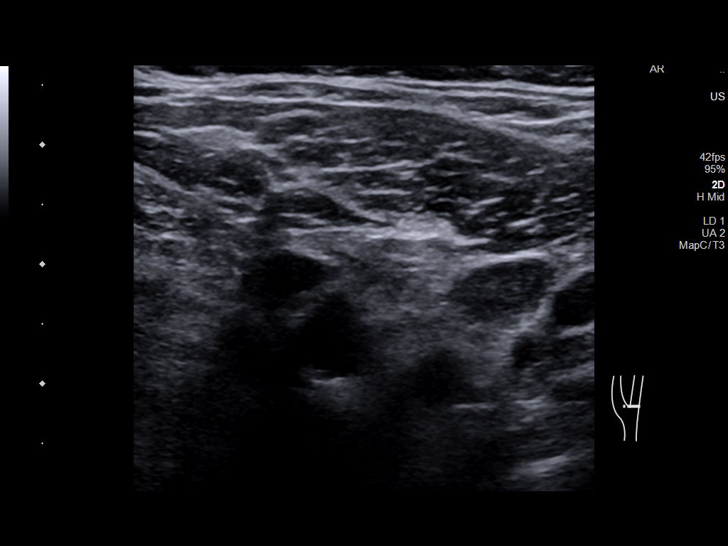
[im 24/68]
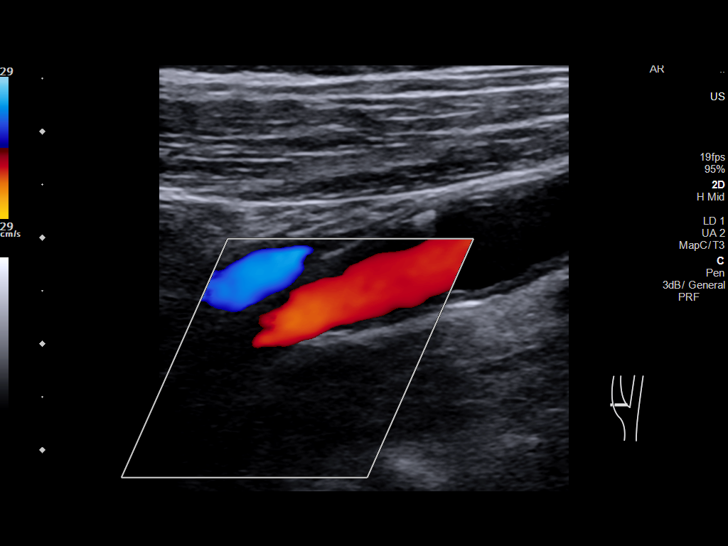
[im 30/68]
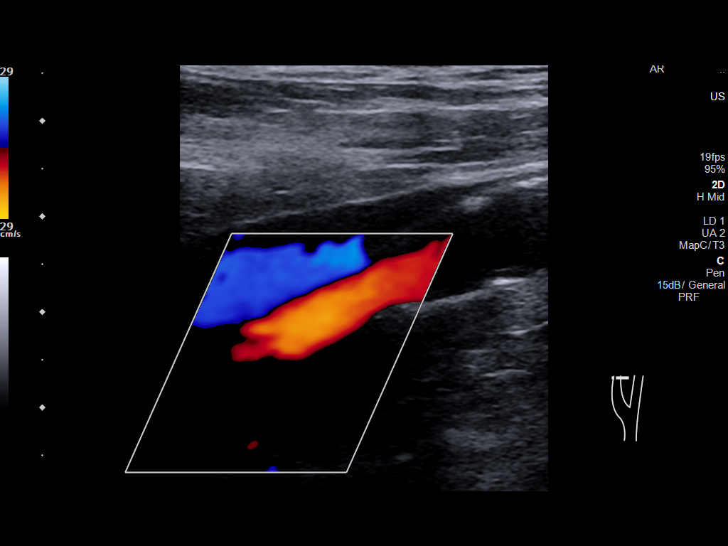
[im 35/68]
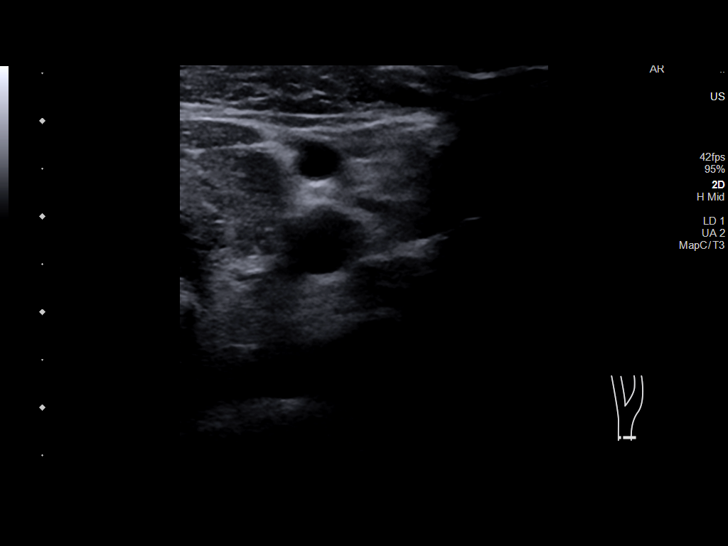
[im 38/68]
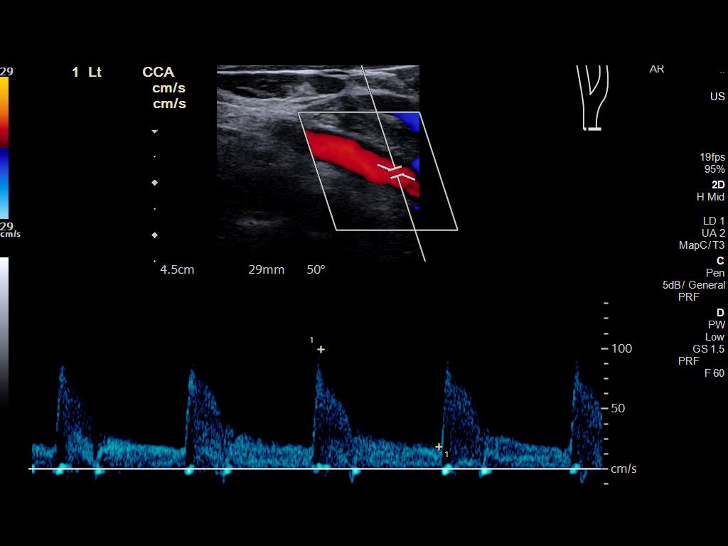
[im 44/68]
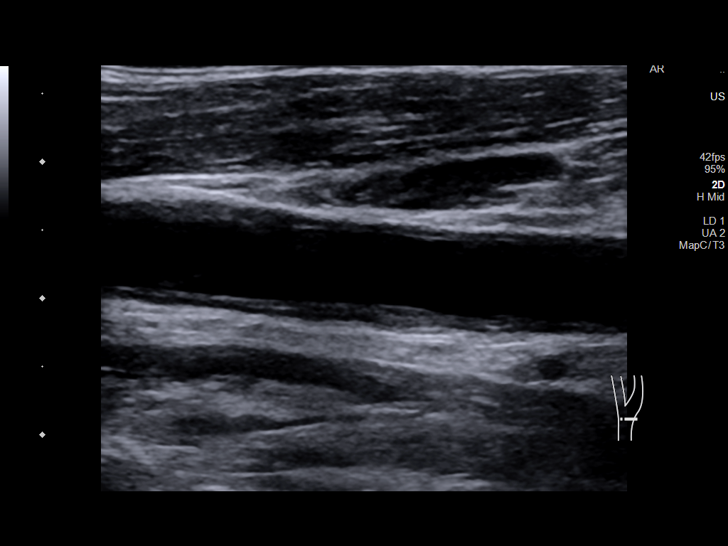
[im 50/68]
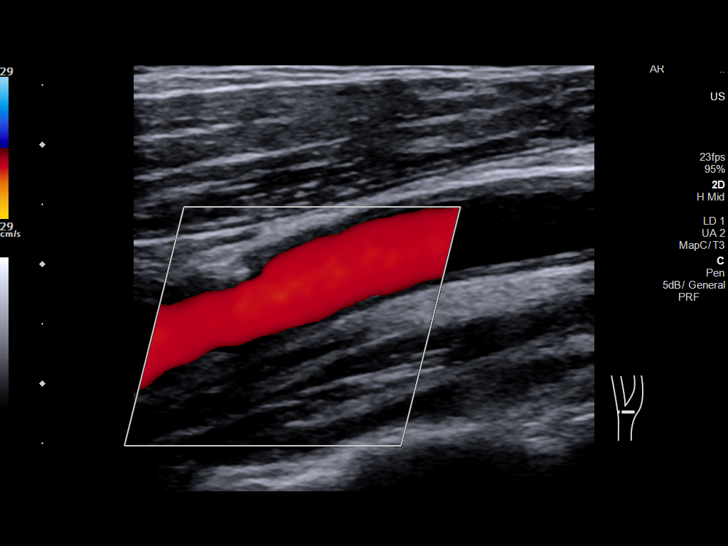
[im 56/68]
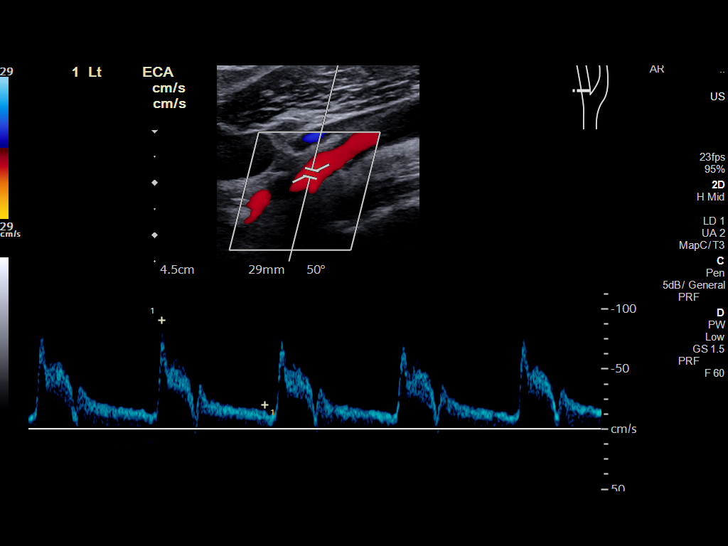
[im 62/68]
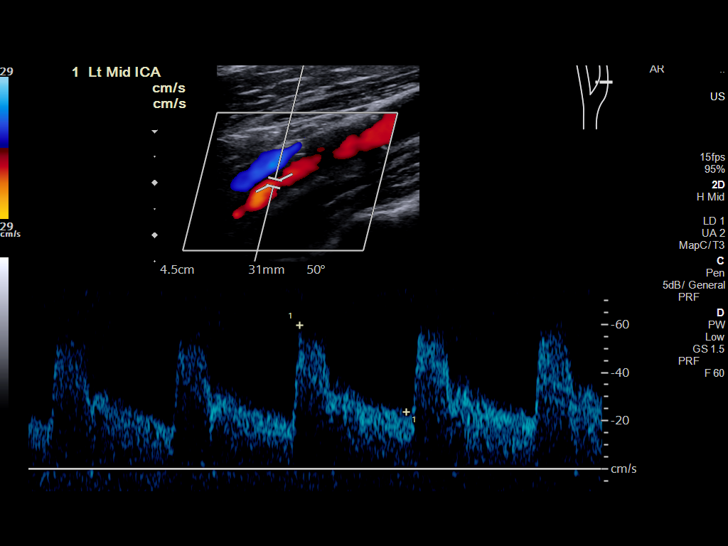
[im 68/68]
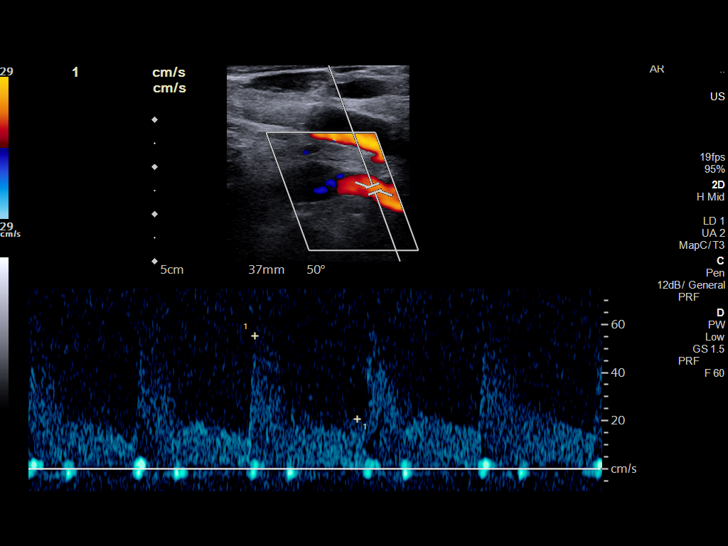

[13 of 24 positions shown; findings below may reference images not displayed]

FINDINGS: Criteria: Quantification of carotid stenosis is based on velocity
parameters that correlate the residual internal carotid diameter
with NASCET-based stenosis levels, using the diameter of the distal
internal carotid lumen as the denominator for stenosis measurement.

The following velocity measurements were obtained:

RIGHT

ICA: 71/25 cm/sec

CCA: 88/2 cm/sec

SYSTOLIC ICA/CCA RATIO:

ECA: 101 cm/sec

LEFT

ICA: 61/23 cm/sec

CCA: 91/24 cm/sec

SYSTOLIC ICA/CCA RATIO:

ECA: 90 cm/sec

RIGHT CAROTID ARTERY: Intimal thickening and smooth noncalcified
eccentric plaque in the distal common carotid artery. Partially
calcified plaque in the bulb and proximal ICA. No high-grade
stenosis. Normal waveforms and color Doppler signal.

RIGHT VERTEBRAL ARTERY:  Normal flow direction and waveform.

LEFT CAROTID ARTERY: Intimal thickening in the mid and distal common
carotid artery and bulb. Calcified plaque in the bulb and proximal
ICA. No high-grade stenosis. Normal waveforms and color Doppler
signal.

LEFT VERTEBRAL ARTERY:  Normal flow direction and waveform.
IMPRESSION: 1. Bilateral carotid bifurcation plaque resulting in less than 50%
diameter ICA stenosis.
2. Antegrade bilateral vertebral arterial flow.

## 2019-10-12 MED ORDER — BLOOD GLUCOSE METER KIT: PACK | 0 refills | Status: AC

## 2019-10-12 MED ORDER — ATORVASTATIN CALCIUM 40 MG PO TABS: 40.0000 mg | ORAL_TABLET | Freq: Every evening | ORAL | 2 refills | Status: AC

## 2019-10-12 MED ORDER — ASPIRIN 81 MG PO TBEC: 81.0000 mg | DELAYED_RELEASE_TABLET | Freq: Every day | ORAL | Status: AC

## 2019-10-12 MED ORDER — METFORMIN HCL ER 500 MG PO TB24: 500.0000 mg | ORAL_TABLET | Freq: Every day | ORAL | 1 refills | Status: DC

## 2019-10-12 NOTE — Progress Notes (Signed)
Nsg Discharge Note  Admit Date:  10/11/2019 Discharge date: 10/12/2019   Scott Robbins to be D/C'd Home per MD order.  AVS completed.  Copy for chart, and copy for patient signed, and dated. Patient/caregiver able to verbalize understanding. Removed IV-clean, dry, intact. Reviewed d/c paperwork with patient and wife. Answered all questions. Stable patient and wife walked to car to d/c to home.  Discharge Medication: Allergies as of 10/12/2019   No Known Allergies     Medication List    STOP taking these medications   simvastatin 20 MG tablet Commonly known as: ZOCOR     TAKE these medications   aspirin 81 MG EC tablet Take 1 tablet (81 mg total) by mouth daily. Start taking on: October 13, 2019   atorvastatin 40 MG tablet Commonly known as: LIPITOR Take 1 tablet (40 mg total) by mouth every evening.   blood glucose meter kit and supplies Dispense based on patient and insurance preference. Use up to four times daily as directed. (FOR ICD-10 E10.9, E11.9).   lisinopril 20 MG tablet Commonly known as: ZESTRIL Take 20 mg by mouth in the morning.   metFORMIN 500 MG 24 hr tablet Commonly known as: Glucophage XR Take 1 tablet (500 mg total) by mouth daily with supper.   vitamin B-12 250 MCG tablet Commonly known as: CYANOCOBALAMIN Take 250 mcg by mouth daily.       Discharge Assessment: Vitals:   10/12/19 1000 10/12/19 1356  BP: 119/72 118/83  Pulse: 60 65  Resp: 16 16  Temp: 98.5 F (36.9 C) 98.2 F (36.8 C)  SpO2: 97% 98%   Skin clean, dry and intact without evidence of skin break down, no evidence of skin tears noted. IV catheter discontinued intact. Site without signs and symptoms of complications - no redness or edema noted at insertion site, patient denies c/o pain - only slight tenderness at site.  Dressing with slight pressure applied.  D/c Instructions-Education: Discharge instructions given to patient/family with verbalized understanding. D/c education  completed with patient/family including follow up instructions, medication list, d/c activities limitations if indicated, with other d/c instructions as indicated by MD - patient able to verbalize understanding, all questions fully answered. Patient instructed to return to ED, call 911, or call MD for any changes in condition.  Patient escorted via [place], and D/C home via private auto.  Santa Lighter, RN 10/12/2019 3:00 PM

## 2019-10-12 NOTE — Discharge Instructions (Signed)
You have tested positive for type 2 diabetes mellitus.  I have referred you for diabetes education and having you start metformin 1 tablet daily.  I have ordered a blood glucose meter and testing kit to have at home to check your blood sugars.   Please follow up with your primary care clinic for further information and testing and treatments.  Please bring your blood sugar readings taken every morning to your primary care provider.  Please stop smoking and using tobacco products.         IMPORTANT INFORMATION: PAY CLOSE ATTENTION   PHYSICIAN DISCHARGE INSTRUCTIONS  Follow with Primary care provider  Celene Squibb, MD  and other consultants as instructed by your Hospitalist Physician  [place] IF SYMPTOMS COME BACK, WORSEN OR NEW PROBLEM DEVELOPS   Please note: You were cared for by a hospitalist during your hospital stay. Every effort will be made to forward records to your primary care provider.  You can request that your primary care provider send for your hospital records if they have not received them.  Once you are discharged, your primary care physician will handle any further medical issues. Please note that NO REFILLS for any discharge medications will be authorized once you are discharged, as it is imperative that you return to your primary care physician (or establish a relationship with a primary care physician if you do not have one) for your post hospital discharge needs so that they can reassess your need for medications and monitor your lab values.  Please get a complete blood count and chemistry panel checked by your Primary MD at your next visit, and again as instructed by your Primary MD.  Get Medicines reviewed and adjusted: Please take all your medications with you for your next visit with your Primary MD  Laboratory/radiological data: Please request your Primary MD to go over all hospital tests and procedure/radiological results at the  follow up, please ask your primary care provider to get all Hospital records sent to his/her office.  In some cases, they will be blood work, cultures and biopsy results pending at the time of your discharge. Please request that your primary care provider follow up on these results.  If you are diabetic, please bring your blood sugar readings with you to your follow up appointment with primary care.    Please call and make your follow up appointments as soon as possible.    Also Note the following: If you experience worsening of your admission symptoms, develop shortness of breath, life threatening emergency, suicidal or homicidal thoughts you must seek medical attention immediately by calling 911 or calling your MD immediately  if symptoms less severe.  You must read complete instructions/literature along with all the possible adverse reactions/side effects for all the Medicines you take and that have been prescribed to you. Take any new Medicines after you have completely understood and accpet all the possible adverse reactions/side effects.   Do not drive when taking Pain medications or sleeping medications (Benzodiazepines)  Do not take more than prescribed Pain, Sleep and Anxiety Medications. It is not advisable to combine anxiety,sleep and pain medications without talking with your primary care practitioner  Special Instructions: If you have smoked or chewed Tobacco  in the last 2 yrs please stop smoking, stop any regular Alcohol  and or any Recreational drug use.  Wear Seat belts while driving.  Do not drive if taking any narcotic, mind altering or controlled  substances or recreational drugs or alcohol.

## 2019-10-12 NOTE — Progress Notes (Signed)
PT Cancellation Note  Patient Details Name: Scott Robbins MRN: [id number] DOB: [birth year]   Cancelled Treatment:    Reason Eval/Treat Not Completed: PT screened, no needs identified, will sign off. Pt screened for PT needs. Pt's symptoms have resolved and he is feeling back to "normal" now. Reports he may wait a few days before returning to work with [employer]. Pt completing mobility independently, including start, stop and turning in circles on commend. No further PT needs at this time.   Floria Raveling. Hartnett-Rands, MS, PT Per [place] [phone] 10/12/2019, 1:21 PM

## 2019-10-12 NOTE — Plan of Care (Signed)
Problem: Education: Goal: Knowledge of General Education information will improve Description: Including pain rating scale, medication(s)/side effects and non-pharmacologic comfort measures 10/12/2019 1708 by Santa Lighter, RN Outcome: Adequate for Discharge 10/12/2019 1708 by Santa Lighter, RN Outcome: Progressing   Problem: Health Behavior/Discharge Planning: Goal: Ability to manage health-related needs will improve 10/12/2019 1708 by Santa Lighter, RN Outcome: Adequate for Discharge 10/12/2019 1708 by Santa Lighter, RN Outcome: Progressing   Problem: Clinical Measurements: Goal: Ability to maintain clinical measurements within normal limits will improve 10/12/2019 1708 by Santa Lighter, RN Outcome: Adequate for Discharge 10/12/2019 1708 by Santa Lighter, RN Outcome: Progressing Goal: Will remain free from infection 10/12/2019 1708 by Santa Lighter, RN Outcome: Adequate for Discharge 10/12/2019 1708 by Santa Lighter, RN Outcome: Progressing Goal: Diagnostic test results will improve 10/12/2019 1708 by Santa Lighter, RN Outcome: Adequate for Discharge 10/12/2019 1708 by Santa Lighter, RN Outcome: Progressing Goal: Respiratory complications will improve 10/12/2019 1708 by Santa Lighter, RN Outcome: Adequate for Discharge 10/12/2019 1708 by Santa Lighter, RN Outcome: Progressing Goal: Cardiovascular complication will be avoided 10/12/2019 1708 by Santa Lighter, RN Outcome: Adequate for Discharge 10/12/2019 1708 by Santa Lighter, RN Outcome: Progressing   Problem: Activity: Goal: Risk for activity intolerance will decrease 10/12/2019 1708 by Santa Lighter, RN Outcome: Adequate for Discharge 10/12/2019 1708 by Santa Lighter, RN Outcome: Progressing   Problem: Nutrition: Goal: Adequate nutrition will be maintained 10/12/2019 1708 by Santa Lighter, RN Outcome: Adequate for Discharge 10/12/2019 1708 by Santa Lighter, RN Outcome: Progressing   Problem:  Coping: Goal: Level of anxiety will decrease 10/12/2019 1708 by Santa Lighter, RN Outcome: Adequate for Discharge 10/12/2019 1708 by Santa Lighter, RN Outcome: Progressing   Problem: Elimination: Goal: Will not experience complications related to bowel motility 10/12/2019 1708 by Santa Lighter, RN Outcome: Adequate for Discharge 10/12/2019 1708 by Santa Lighter, RN Outcome: Progressing Goal: Will not experience complications related to urinary retention 10/12/2019 1708 by Santa Lighter, RN Outcome: Adequate for Discharge 10/12/2019 1708 by Santa Lighter, RN Outcome: Progressing   Problem: Pain Managment: Goal: General experience of comfort will improve 10/12/2019 1708 by Santa Lighter, RN Outcome: Adequate for Discharge 10/12/2019 1708 by Santa Lighter, RN Outcome: Progressing   Problem: Safety: Goal: Ability to remain free from injury will improve 10/12/2019 1708 by Santa Lighter, RN Outcome: Adequate for Discharge 10/12/2019 1708 by Santa Lighter, RN Outcome: Progressing   Problem: Skin Integrity: Goal: Risk for impaired skin integrity will decrease 10/12/2019 1708 by Santa Lighter, RN Outcome: Adequate for Discharge 10/12/2019 1708 by Santa Lighter, RN Outcome: Progressing   Problem: Education: Goal: Knowledge of disease or condition will improve 10/12/2019 1708 by Santa Lighter, RN Outcome: Adequate for Discharge 10/12/2019 1708 by Santa Lighter, RN Outcome: Progressing 10/12/2019 0920 by Santa Lighter, RN Outcome: Progressing Goal: Knowledge of secondary prevention will improve 10/12/2019 1708 by Santa Lighter, RN Outcome: Adequate for Discharge 10/12/2019 1708 by Santa Lighter, RN Outcome: Progressing 10/12/2019 0920 by Santa Lighter, RN Outcome: Progressing Goal: Knowledge of patient specific risk factors addressed and post discharge goals established will improve 10/12/2019 1708 by Santa Lighter, RN Outcome: Adequate for Discharge 10/12/2019 1708 by  Santa Lighter, RN Outcome: Progressing 10/12/2019 0920 by Santa Lighter, RN Outcome: Progressing   Problem: Coping: Goal: Will verbalize positive feelings about self 10/12/2019 1708 by Santa Lighter, RN Outcome: Adequate for Discharge 10/12/2019 1708 by Santa Lighter, RN Outcome: Progressing 10/12/2019 0920 by Santa Lighter, RN Outcome: Progressing Goal: Will identify appropriate support needs 10/12/2019 1708 by Santa Lighter, RN Outcome: Adequate  for Discharge 10/12/2019 1708 by Santa Lighter, RN Outcome: Progressing 10/12/2019 0920 by Santa Lighter, RN Outcome: Progressing   Problem: Health Behavior/Discharge Planning: Goal: Ability to manage health-related needs will improve 10/12/2019 1708 by Santa Lighter, RN Outcome: Adequate for Discharge 10/12/2019 1708 by Santa Lighter, RN Outcome: Progressing 10/12/2019 0920 by Santa Lighter, RN Outcome: Progressing   Problem: Self-Care: Goal: Ability to participate in self-care as condition permits will improve 10/12/2019 1708 by Santa Lighter, RN Outcome: Adequate for Discharge 10/12/2019 1708 by Santa Lighter, RN Outcome: Progressing 10/12/2019 0920 by Santa Lighter, RN Outcome: Progressing Goal: Verbalization of feelings and concerns over difficulty with self-care will improve 10/12/2019 1708 by Santa Lighter, RN Outcome: Adequate for Discharge 10/12/2019 1708 by Santa Lighter, RN Outcome: Progressing 10/12/2019 0920 by Santa Lighter, RN Outcome: Progressing   Problem: Ischemic Stroke/TIA Tissue Perfusion: Goal: Complications of ischemic stroke/TIA will be minimized 10/12/2019 1708 by Santa Lighter, RN Outcome: Adequate for Discharge 10/12/2019 1708 by Santa Lighter, RN Outcome: Progressing 10/12/2019 0920 by Santa Lighter, RN Outcome: Progressing

## 2019-10-12 NOTE — Progress Notes (Signed)
*  PRELIMINARY RESULTS* Echocardiogram 2D Echocardiogram has been performed.  Leavy Cella 10/12/2019, 10:06 AM

## 2019-10-12 NOTE — Plan of Care (Signed)
  Problem: Education: Goal: Knowledge of disease or condition will improve Outcome: Progressing Goal: Knowledge of secondary prevention will improve Outcome: Progressing Goal: Knowledge of patient specific risk factors addressed and post discharge goals established will improve Outcome: Progressing   Problem: Coping: Goal: Will verbalize positive feelings about self Outcome: Progressing Goal: Will identify appropriate support needs Outcome: Progressing   Problem: Health Behavior/Discharge Planning: Goal: Ability to manage health-related needs will improve Outcome: Progressing   Problem: Self-Care: Goal: Ability to participate in self-care as condition permits will improve Outcome: Progressing Goal: Verbalization of feelings and concerns over difficulty with self-care will improve Outcome: Progressing   Problem: Ischemic Stroke/TIA Tissue Perfusion: Goal: Complications of ischemic stroke/TIA will be minimized Outcome: Progressing   

## 2019-10-12 NOTE — Progress Notes (Signed)
OT Cancellation Note  Patient Details Name: Scott Robbins MRN: [id number] DOB: November 24, 1960   Cancelled Treatment:    Reason Eval/Treat Not Completed: OT screened, no needs identified, will sign off . Pt screened for OT needs. Pt's symptoms have resolved and he is feeling back to "normal" now. Pt completing ADLs and mobility independently. No further OT needs at this time.   Guadelupe Sabin, OTR/L  [phone] 10/12/2019, 7:44 AM

## 2019-10-12 NOTE — Discharge Summary (Addendum)
Physician Discharge Summary  Scott Robbins:[id number] DOB: 09-Aug-1960 DOA: 10/11/2019  PCP: Celene Squibb, MD  Admit date: 10/11/2019 Discharge date: 10/12/2019  Admitted From:  HOME  Disposition:  HOME   Recommendations for Outpatient Follow-up:  1. Follow up with PCP in 1 weeks 2. Follow up with neurologist in 2-4 weeks 3. Please obtain BMP in 1-2 weeks 4. Please follow up with repeat A1c in 2-3 months to follow diabetes 5. Please follow up with ENT regarding sinus disease.    Discharge Condition: STABLE   CODE STATUS: FULL    Brief Hospitalization Summary: Please see all hospital notes, images, labs for full details of the hospitalization. Scott Robbins is an 59 y.o. male with PMH significant for HTN and dyslipidemia who was in his usual state of health until earlier this morning when he felt "my legs felt heavy and I felt dizzy "as he was walking back to his truck.  Patient got into his truck and turned the air conditioning on and thought he felt better however subsequently developed numb feeling in his inner left thigh as well as his left hand, palmar aspect.  Patient notes the symptoms lasted 5 minutes and then resolved.  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  Patient went to see his boss who said "your color is way off" so he came to the ED.  By the time the patient came to the ED he felt to be back at baseline.  Of note patient had similar symptoms of heavy [place] feeling of his legs associated with dizziness 2 weeks ago.  These resolved spontaneously without intervention.  Patient denies any fevers or chills.  No headache or photophobia.  Denies any recent change in baseline back pain.  No back or head injuries or injuries of any sort.  Patient denies being dehydrated, has not been working out in the heat, notes his cab of his truck has air conditioning and he drinks a lot of  water.  No new medication changes lately.  No nausea or vomiting or abdominal pain.  No chest pain or palpitations.  No change in the size of his legs, no new lower extremity edema.  No pleuritic chest pain.  No new cough.  ED Course:  The patient was noted to be afebrile.  Patient was seen by teleneurology who recommended admission for TIA work-up as well as MRA of the head and MR of lumbar spine.  The patient was admitted for observation for TIA like symptoms.  The symptoms had resolved after admission.  Was complaining of dizziness he was complaining of dizziness and hand and left leg numbness which has completely resolved.  His work-up included an MRI brain with no findings of CVA.  He also had an MRI of the lumbar spine without contrast.  Chronic findings noted.  The patient also had a 2D echocardiogram which is noted below no significant abnormalities found.  His LDL cholesterol was elevated and his statin was changed to atorvastatin 40 mg daily.  He was started on aspirin 81 mg daily for secondary prevention.  His hemoglobin A1c was 6.7% which is diagnostic for type 2 diabetes mellitus.  Referral has been made to outpatient diabetes education and nutrition education.  He was given a prescription for Glucophage XR 500 mg 1 tablet with supper the patient was given written information regarding type 2 diabetes mellitus.  The patient was given a prescription for blood glucose meter and testing supplies.  He is feeling much better and he has had no further symptoms.  He is stable to discharge home with outpatient follow-up with his primary care physician and neurology.   The patient was strongly advised to please stop smoking cigarettes and using tobacco products.    Discharge Diagnoses:  Principal Problem:   TIA (transient ischemic attack) Active Problems:   HTN (hypertension)   Dyslipidemia   Numbness and tingling of left leg   Type 2 diabetes mellitus ([place])   Discharge  Instructions: Discharge Instructions    Referral to Nutrition and Diabetes Services   Complete by: As directed    Choose type of Diabetes Self-Management Training (DSMT) training services and number of hours requested: Initial DSMT: 10 hours   Check all special needs that apply to patient requiring 1 on 1 DSMT: Low literacy   DSMT Content: Comprehensive self-management skills- All of the content areas   Choose the type of Medical Nutrition Therapy (MNT) and number of hours: Initial MNT: 3 hours   FOR MEDICARE PATIENTS: I hereby certify that I am managing this beneficiary's diabetes condition and that the above prescribed training is a necessary part of management.: Yes     Allergies as of 10/12/2019   No Known Allergies     Medication List    STOP taking these medications   simvastatin 20 MG tablet Commonly known as: ZOCOR     TAKE these medications   aspirin 81 MG EC tablet Take 1 tablet (81 mg total) by mouth daily. Start taking on: October 13, 2019   atorvastatin 40 MG tablet Commonly known as: LIPITOR Take 1 tablet (40 mg total) by mouth every evening.   blood glucose meter kit and supplies Dispense based on patient and insurance preference. Use up to four times daily as directed. (FOR ICD-10 E10.9, E11.9).   lisinopril 20 MG tablet Commonly known as: ZESTRIL Take 20 mg by mouth in the morning.   metFORMIN 500 MG 24 hr tablet Commonly known as: Glucophage XR Take 1 tablet (500 mg total) by mouth daily with supper.   vitamin B-12 250 MCG tablet Commonly known as: CYANOCOBALAMIN Take 250 mcg by mouth daily.       Follow-up Information    Celene Squibb, MD. Schedule an appointment as soon as possible for a visit in 1 week(s).   Specialty: Internal Medicine Contact information: [place] [facility] [address] [phone]        Phillips Odor, MD. Schedule an appointment as soon as possible for a visit in 1 week(s).   Specialty: Neurology Why:  hospital Follow up for TIA Contact information: [address] [place] [facility] [address] [phone]        Leta Baptist, MD. Schedule an appointment as soon as possible for a visit in 1 month(s).   Specialty: Otolaryngology Why: Establish care for sinus disease  Contact information: [address] [place] [address] [phone]              No Known Allergies Allergies as of 10/12/2019   No Known Allergies     Medication List    STOP taking these medications   simvastatin 20 MG tablet Commonly known as: ZOCOR     TAKE these medications   aspirin 81 MG EC tablet Take 1 tablet (81 mg total) by mouth daily. Start taking on: October 13, 2019  atorvastatin 40 MG tablet Commonly known as: LIPITOR Take 1 tablet (40 mg total) by mouth every evening.   blood glucose meter kit and supplies Dispense based on patient and insurance preference. Use up to four times daily as directed. (FOR ICD-10 E10.9, E11.9).   lisinopril 20 MG tablet Commonly known as: ZESTRIL Take 20 mg by mouth in the morning.   metFORMIN 500 MG 24 hr tablet Commonly known as: Glucophage XR Take 1 tablet (500 mg total) by mouth daily with supper.   vitamin B-12 250 MCG tablet Commonly known as: CYANOCOBALAMIN Take 250 mcg by mouth daily.       Procedures/Studies: DG Chest 2 View  Result Date: 10/11/2019 CLINICAL DATA:  Chest pain. Patient reports blurry vision. EXAM: CHEST - 2 VIEW COMPARISON:  None. FINDINGS: The cardiomediastinal contours are normal. Mild peribronchial/interstitial thickening. Pulmonary vasculature is normal. No consolidation, pleural effusion, or pneumothorax. No acute osseous abnormalities are seen. IMPRESSION: Mild peribronchial/interstitial thickening, can be seen with smoking related lung disease, bronchitis or asthma. Electronically Signed   By: Keith Rake M.D.   On: 10/11/2019 13:42   CT Head Wo Contrast  Result Date: 10/11/2019 CLINICAL DATA:  2  episodes of blurry vision and numbness and tingling in both legs this morning. EXAM: CT HEAD WITHOUT CONTRAST TECHNIQUE: Contiguous axial images were obtained from the base of the skull through the vertex without intravenous contrast. COMPARISON:  None. FINDINGS: Brain: No evidence of acute infarction, hemorrhage, hydrocephalus, extra-axial collection or mass lesion/mass effect. Vascular: No hyperdense vessel or unexpected calcification. Skull: Intact.  No focal lesion. Sinuses/Orbits: The maxillary sinuses are completely opacified. Scattered ethmoid air cell disease and mucosal thickening in the right sphenoid sinus noted. There is also some mucosal thickening in the right frontal sinus. Other: None. IMPRESSION: No acute intracranial abnormality. Sinus disease.  The maxillary sinuses are completely opacified. Electronically Signed   By: Inge Rise M.D.   On: 10/11/2019 13:30   MR ANGIO HEAD WO CONTRAST  Result Date: 10/11/2019 CLINICAL DATA:  59 year old male with episodes of blurred vision, numbness and tingling. EXAM: MRA HEAD WITHOUT CONTRAST TECHNIQUE: Angiographic images of the Circle of Willis were obtained using MRA technique without intravenous contrast. COMPARISON:  Brain MRI and head CT today reported separately. FINDINGS: Antegrade flow in the posterior circulation. Mildly dominant left vertebral artery. Normal PICA origins and no distal vertebral stenosis. Patent basilar artery without stenosis. Patent SCA and PCA origins. Posterior communicating arteries are diminutive or absent. Bilateral PCA branches are within normal limits. Antegrade flow in both ICA siphons. The visible ICAs appear patent without stenosis. Normal ophthalmic artery origins. Patent carotid termini, normal MCA and ACA origins. Anterior communicating artery and visible ACA branches are within normal limits. MCA M1 segments, MCA bifurcations and visible bilateral MCA branches are within normal limits. IMPRESSION: 1. Negative  intracranial MRA. 2. See also Brain MRI today reported separately. Electronically Signed   By: Genevie Ann M.D.   On: 10/11/2019 18:15   MR BRAIN WO CONTRAST  Result Date: 10/11/2019 CLINICAL DATA:  59 year old male with episodes of blurred vision, numbness and tingling. EXAM: MRI HEAD WITHOUT CONTRAST TECHNIQUE: Multiplanar, multiecho pulse sequences of the brain and surrounding structures were obtained without intravenous contrast. COMPARISON:  Head CT earlier today. FINDINGS: Brain: No restricted diffusion to suggest acute infarction. No midline shift, mass effect, evidence of mass lesion, ventriculomegaly, extra-axial collection or acute intracranial hemorrhage. Cervicomedullary junction and pituitary are within normal limits. Pearline Cables and white matter signal is  within normal limits for age throughout the brain. No cortical encephalomalacia or chronic cerebral blood products identified. Vascular: Major intracranial vascular flow voids are preserved. Skull and upper cervical spine: Negative visible cervical spine, bone marrow signal. Sinuses/Orbits: Extensive bilateral maxillary sinus disease which appears to be a combination of bulky mucosal thickening and trap/inspissated secretions. Mild to moderate paranasal sinus mucosal thickening elsewhere, predominantly the hyperplastic right sphenoid sinus. Leftward nasal septal deviation. Bilateral OMCs are affected but nasal cavity otherwise largely spared. Orbits soft tissues appear to remain normal. Other: Mastoid air cells are clear. Visible internal auditory structures appear normal. Scalp and face appear negative. IMPRESSION: 1. Normal for age noncontrast MRI appearance of the brain. 2. Paranasal sinus disease, with pronounced bilateral OMC obstructive pattern of disease. Although no complicating features identified. Electronically Signed   By: Genevie Ann M.D.   On: 10/11/2019 18:12   MR Lumbar Spine W Wo Contrast  Result Date: 10/11/2019 CLINICAL DATA:  Leg weakness,  numbness and tingling. EXAM: MRI LUMBAR SPINE WITHOUT AND WITH CONTRAST TECHNIQUE: Multiplanar and multiecho pulse sequences of the lumbar spine were obtained without and with intravenous contrast. CONTRAST:  16m GADAVIST GADOBUTROL 1 MMOL/ML IV SOLN COMPARISON:  None. FINDINGS: Segmentation: There are five lumbar type vertebral bodies. The last full intervertebral disc space is labeled L5-S1. Alignment:  Normal Vertebrae:  Normal marrow signal.  No bone lesions or fractures. Conus medullaris and cauda equina: Conus extends to the T12-L1 level. Conus and cauda equina appear normal. Paraspinal and other soft tissues: No significant paraspinal or retroperitoneal findings. There is moderate distention of the bladder noted estimated volume is 750 cc. Disc levels: L1-2: Mild facet disease but no disc protrusions, spinal or foraminal stenosis. L2-3: Mild facet disease but no disc protrusions, spinal or foraminal stenosis. L3-4: Shallow central disc protrusion with mild impression on the ventral thecal sac. No significant spinal or foraminal stenosis. Mild to moderate facet disease. L4-5: Right foraminal annular rent and very shallow disc protrusion but no direct neural compression. Potential irritation of the right L4 nerve root. No spinal or lateral recess stenosis. Mild to moderate facet disease. L5-S1: Diffuse annular bulge with mild flattening of the ventral thecal sac. Moderate facet disease without definite pars defects. No significant spinal or foraminal stenosis. IMPRESSION: 1. Shallow central disc protrusion at L3-4 with mild impression on the ventral thecal sac. 2. Right foraminal annular rent and very shallow disc protrusion at L4-5 but no direct neural compression. Potential irritation of the right L4 nerve root. 3. Moderate facet disease at L5-S1 but no definite pars defects. 4. Moderate bladder distention. Electronically Signed   By: PMarijo SanesM.D.   On: 10/11/2019 18:17   [place] [facility] [place] [facility]  Result Date: 10/12/2019 CLINICAL DATA:  Vertigo, left lower extremity numbness x2 days. Hypertension, hyperlipidemia, previous tobacco abuse EXAM: BILATERAL CAROTID DUPLEX ULTRASOUND TECHNIQUE: GPearline Cablesscale imaging, color Doppler and duplex ultrasound were performed of bilateral carotid and vertebral arteries in the neck. COMPARISON:  None. FINDINGS: Criteria: Quantification of carotid stenosis is based on velocity parameters that correlate the residual internal carotid diameter with NASCET-based stenosis levels, using the diameter of the distal internal carotid lumen as the denominator for stenosis measurement. The following velocity measurements were obtained: RIGHT ICA: 71/25 cm/sec CCA: 860/6cm/sec SYSTOLIC ICA/CCA RATIO:  0.8 ECA: 101 cm/sec LEFT ICA: 61/23 cm/sec CCA: 930/16cm/sec SYSTOLIC ICA/CCA RATIO:  0.7 ECA: 90 cm/sec RIGHT CAROTID ARTERY: Intimal thickening and smooth noncalcified eccentric  plaque in the distal common carotid artery. Partially calcified plaque in the bulb and proximal ICA. No high-grade stenosis. Normal waveforms and color Doppler signal. RIGHT VERTEBRAL ARTERY:  Normal flow direction and waveform. LEFT CAROTID ARTERY: Intimal thickening in the mid and distal common carotid artery and bulb. Calcified plaque in the bulb and proximal ICA. No high-grade stenosis. Normal waveforms and color Doppler signal. LEFT VERTEBRAL ARTERY:  Normal flow direction and waveform. IMPRESSION: 1. Bilateral carotid bifurcation plaque resulting in less than 50% diameter ICA stenosis. 2. Antegrade bilateral vertebral arterial flow. Electronically Signed   By: Lucrezia Europe M.D.   On: 10/12/2019 09:43   ECHOCARDIOGRAM COMPLETE  Result Date: 10/12/2019    ECHOCARDIOGRAM REPORT   Patient Name:   CASTON COOPERSMITH Date of Exam: 10/12/2019 Medical Rec #:  [id number]     Height:       72.0 in Accession #:    [id number]    Weight:       208.0 lb Date of Birth:  06/11/1960     BSA:          2.166 m Patient  Age:    1 years      BP:           106/74 mmHg Patient Gender: M             HR:           66 bpm. Exam Location:  [facility] Procedure: 2D Echo Indications:    TIA 435.9 / G45.9  History:        Patient has no prior history of Echocardiogram examinations.                 TIA; Risk Factors:Hypertension, Dyslipidemia and Current Smoker.                 Numbness.  Sonographer:    Leavy Cella RDCS (AE) Referring Phys: [id number] [place]  1. Left ventricular ejection fraction, by estimation, is 60 to 65%. The left ventricle has normal function. The left ventricle has no regional wall motion abnormalities. Left ventricular diastolic parameters were normal.  2. Right ventricular systolic function is normal. The right ventricular size is normal.  3. The mitral valve is normal in structure. No evidence of mitral valve regurgitation. No evidence of mitral stenosis.  4. The aortic valve is normal in structure. Aortic valve regurgitation is not visualized. No aortic stenosis is present.  5. The inferior vena cava is normal in size with greater than 50% respiratory variability, suggesting right atrial pressure of 3 mmHg. FINDINGS  Left Ventricle: Left ventricular ejection fraction, by estimation, is 60 to 65%. The left ventricle has normal function. The left ventricle has no regional wall motion abnormalities. The left ventricular internal cavity size was normal in size. There is  borderline left ventricular hypertrophy. Left ventricular diastolic parameters were normal. Right Ventricle: The right ventricular size is normal. No increase in right ventricular wall thickness. Right ventricular systolic function is normal. Left Atrium: Left atrial size was normal in size. Right Atrium: Right atrial size was normal in size. Pericardium: There is no evidence of pericardial effusion. Mitral Valve: The mitral valve is normal in structure. There is mild thickening of the mitral valve leaflet(s). There is  mild calcification of the mitral valve leaflet(s). Normal mobility of the mitral valve leaflets. Mild mitral annular calcification. No evidence of mitral valve regurgitation. No evidence of mitral valve stenosis. Tricuspid Valve: The tricuspid valve  is normal in structure. Tricuspid valve regurgitation is trivial. No evidence of tricuspid stenosis. Aortic Valve: The aortic valve is normal in structure. Aortic valve regurgitation is not visualized. No aortic stenosis is present. Pulmonic Valve: The pulmonic valve was normal in structure. Pulmonic valve regurgitation is not visualized. No evidence of pulmonic stenosis. Aorta: The aortic root is normal in size and structure. Venous: The inferior vena cava is normal in size with greater than 50% respiratory variability, suggesting right atrial pressure of 3 mmHg. IAS/Shunts: No atrial level shunt detected by color flow Doppler.  LEFT VENTRICLE PLAX 2D LVIDd:         4.29 cm  Diastology LVIDs:         2.63 cm  LV e' lateral:   6.96 cm/s LV PW:         1.24 cm  LV E/e' lateral: 10.9 LV IVS:        1.19 cm  LV e' medial:    5.87 cm/s LVOT diam:     2.10 cm  LV E/e' medial:  13.0 LVOT Area:     3.46 cm  RIGHT VENTRICLE RV S prime:     12.10 cm/s TAPSE (M-mode): 2.5 cm LEFT ATRIUM             Index       RIGHT ATRIUM           Index LA diam:        3.30 cm 1.52 cm/m  RA Area:     15.40 cm LA Vol (A2C):   32.5 ml 15.00 ml/m RA Volume:   48.10 ml  22.20 ml/m LA Vol (A4C):   37.4 ml 17.26 ml/m LA Biplane Vol: 36.1 ml 16.66 ml/m   AORTA Ao Root diam: 3.40 cm MITRAL VALVE MV Area (PHT): 2.56 cm    SHUNTS MV Decel Time: 296 msec    Systemic Diam: 2.10 cm MV E velocity: 76.20 cm/s MV A velocity: 67.40 cm/s MV E/A ratio:  1.13 Jenkins Rouge MD Electronically signed by Jenkins Rouge MD Signature Date/Time: 10/12/2019/10:41:37 AM    Final      Subjective: Pt is without complaints, no recurrence of symptoms, no CP, no SOB.   Discharge Exam: Vitals:   10/12/19 0500 10/12/19  1000  BP: 106/74 119/72  Pulse: 66 60  Resp: 15 16  Temp: 98.6 F (37 C) 98.5 F (36.9 C)  SpO2: 98% 97%   Vitals:   10/11/19 2210 10/12/19 0200 10/12/19 0500 10/12/19 1000  BP: 124/90 117/73 106/74 119/72  Pulse: 86 77 66 60  Resp: _0 Temp: 99 F (37.2 C) 98.6 F (37 C) 98.6 F (37 C) 98.5 F (36.9 C)  TempSrc: Oral Oral Oral Oral  SpO2: 98% 98% 98% 97%  Weight: 94.3 kg     Height: 6' (1.829 m)       General: Pt is alert, awake, not in acute distress Cardiovascular: RRR, S1/S2 +, no rubs, no gallops Respiratory: CTA bilaterally, no wheezing, no rhonchi Abdominal: Soft, NT, ND, bowel sounds + Extremities: no edema, no cyanosis Neurological: nonfocal exam.    The results of significant diagnostics from this hospitalization (including imaging, microbiology, ancillary and laboratory) are listed below for reference.     Microbiology: Recent Results (from the past 240 hour(s))  SARS Coronavirus 2 by RT PCR (hospital order, performed in [facility] hospital lab) Nasopharyngeal Nasopharyngeal Swab     Status: None   Collection Time: 10/11/19  4:18  PM   Specimen: Nasopharyngeal Swab  Result Value Ref Range Status   SARS Coronavirus 2 NEGATIVE NEGATIVE Final    Comment: (NOTE) SARS-CoV-2 target nucleic acids are NOT DETECTED. The SARS-CoV-2 RNA is generally detectable in upper and lower respiratory specimens during the acute phase of infection. The lowest concentration of SARS-CoV-2 viral copies this assay can detect is 250 copies / mL. A negative result does not preclude SARS-CoV-2 infection and should not be used as the sole basis for treatment or other patient management decisions.  A negative result may occur with improper specimen collection / handling, submission of specimen other than nasopharyngeal swab, presence of viral mutation(s) within the areas targeted by this assay, and inadequate number of viral copies (<250 copies / mL). A negative result must  be combined with clinical observations, patient history, and epidemiological information. Fact Sheet for Patients:   StrictlyIdeas.no Fact Sheet for Healthcare Providers: BankingDealers.co.za This test is not yet approved or cleared  by the [place] FDA and has been authorized for detection and/or diagnosis of SARS-CoV-2 by FDA under an Emergency Use Authorization (EUA).  This EUA will remain in effect (meaning this test can be used) for the duration of the COVID-19 declaration under Section 564(b)(1) of the Act, 21 U.S.C. section 360bbb-3(b)(1), unless the authorization is terminated or revoked sooner. Performed at [facility], [address] [place], [place] [address]      Labs: BNP (last 3 results) No results for input(s): BNP in the last 8760 hours. Basic Metabolic Panel: Recent Labs  Lab 10/11/19 1230 10/12/19 0556  NA 137 138  K 3.7 4.0  CL 103 107  CO2 24 24  GLUCOSE 148* 126*  BUN 10 12  CREATININE 0.83 0.87  CALCIUM 9.6 9.0   Liver Function Tests: No results for input(s): AST, ALT, ALKPHOS, BILITOT, PROT, ALBUMIN in the last 168 hours. No results for input(s): LIPASE, AMYLASE in the last 168 hours. No results for input(s): AMMONIA in the last 168 hours. CBC: Recent Labs  Lab 10/11/19 1230 10/12/19 0556  WBC 9.2 7.1  HGB 16.1 14.4  HCT 47.1 42.4  MCV 90.9 91.2  PLT 288 260   Cardiac Enzymes: No results for input(s): CKTOTAL, CKMB, CKMBINDEX, TROPONINI in the last 168 hours. BNP: Invalid input(s): POCBNP CBG: No results for input(s): GLUCAP in the last 168 hours. D-Dimer No results for input(s): DDIMER in the last 72 hours. Hgb A1c Recent Labs    10/12/19 0556  HGBA1C 6.7*   Lipid Profile Recent Labs    10/12/19 0556  CHOL 163  HDL 30*  LDLCALC 112*  TRIG 107  CHOLHDL 5.4   Thyroid function studies No results for input(s): TSH, T4TOTAL, T3FREE, THYROIDAB in the last 72  hours.  Invalid input(s): FREET3 Anemia work up No results for input(s): VITAMINB12, FOLATE, FERRITIN, TIBC, IRON, RETICCTPCT in the last 72 hours. Urinalysis No results found for: COLORURINE, APPEARANCEUR, [place], [place], [place], [place], [place], [place], PROTEINUR, UROBILINOGEN, NITRITE, LEUKOCYTESUR Sepsis Labs Invalid input(s): PROCALCITONIN,  WBC,  LACTICIDVEN Microbiology Recent Results (from the past 240 hour(s))  SARS Coronavirus 2 by RT PCR (hospital order, performed in [facility] hospital lab) Nasopharyngeal Nasopharyngeal Swab     Status: None   Collection Time: 10/11/19  4:18 PM   Specimen: Nasopharyngeal Swab  Result Value Ref Range Status   SARS Coronavirus 2 NEGATIVE NEGATIVE Final    Comment: (NOTE) SARS-CoV-2 target nucleic acids are NOT DETECTED. The SARS-CoV-2 RNA is generally detectable in upper and  lower respiratory specimens during the acute phase of infection. The lowest concentration of SARS-CoV-2 viral copies this assay can detect is 250 copies / mL. A negative result does not preclude SARS-CoV-2 infection and should not be used as the sole basis for treatment or other patient management decisions.  A negative result may occur with improper specimen collection / handling, submission of specimen other than nasopharyngeal swab, presence of viral mutation(s) within the areas targeted by this assay, and inadequate number of viral copies (<250 copies / mL). A negative result must be combined with clinical observations, patient history, and epidemiological information. Fact Sheet for Patients:   StrictlyIdeas.no Fact Sheet for Healthcare Providers: BankingDealers.co.za This test is not yet approved or cleared  by the [place] FDA and has been authorized for detection and/or diagnosis of SARS-CoV-2 by FDA under an Emergency Use Authorization (EUA).  This EUA will remain in effect (meaning this test can  be used) for the duration of the COVID-19 declaration under Section 564(b)(1) of the Act, 21 U.S.C. section 360bbb-3(b)(1), unless the authorization is terminated or revoked sooner. Performed at [facility], [address] [place], [place] [address]    Time coordinating discharge:  SIGNED:  Irwin Brakeman, MD  [facility] 10/12/2019, 1:42 PM How to contact the [facility] Attending or Consulting provider [place] or covering provider during after hours [place], for this patient?  1. Check the care team in [facility] and look for a) attending/consulting [facility] provider listed and b) the [facility] team listed 2. Log into [email or web address] and use [facility] universal password to access. If you do not have the password, please contact the hospital operator. 3. Locate the [facility] provider you are looking for under [facility] and page to a number that you can be directly reached. 4. If you still have difficulty reaching the provider, please page the [facility] (Director on Call) for the Hospitalists listed on amion for assistance.

## 2022-01-21 ENCOUNTER — Encounter: Payer: Self-pay | Admitting: *Deleted

## 2022-02-23 ENCOUNTER — Other Ambulatory Visit (HOSPITAL_COMMUNITY): Payer: Self-pay | Admitting: Adult Health Nurse Practitioner

## 2022-02-23 DIAGNOSIS — Z87891 Personal history of nicotine dependence: Secondary | ICD-10-CM

## 2022-04-02 ENCOUNTER — Ambulatory Visit (HOSPITAL_COMMUNITY): Payer: BC Managed Care – PPO

## 2022-04-02 ENCOUNTER — Encounter (HOSPITAL_COMMUNITY): Payer: Self-pay

## 2024-01-06 ENCOUNTER — Encounter: Payer: Self-pay | Admitting: Internal Medicine

## 2024-01-06 ENCOUNTER — Ambulatory Visit: Payer: PRIVATE HEALTH INSURANCE | Attending: Internal Medicine | Admitting: Internal Medicine

## 2024-01-06 VITALS — BP 130/82 | HR 80 | Ht 72.0 in | Wt 204.4 lb

## 2024-01-06 DIAGNOSIS — I359 Nonrheumatic aortic valve disorder, unspecified: Secondary | ICD-10-CM | POA: Insufficient documentation

## 2024-01-06 DIAGNOSIS — I3481 Nonrheumatic mitral (valve) annulus calcification: Secondary | ICD-10-CM | POA: Diagnosis not present

## 2024-01-06 DIAGNOSIS — I2510 Atherosclerotic heart disease of native coronary artery without angina pectoris: Secondary | ICD-10-CM | POA: Insufficient documentation

## 2024-01-06 DIAGNOSIS — Z136 Encounter for screening for cardiovascular disorders: Secondary | ICD-10-CM

## 2024-01-06 NOTE — Progress Notes (Signed)
 Cardiology Office Note  Date: 01/06/2024   ID: Scott Robbins, DOB 19-Dec-1960, MRN [id number]  PCP:  Suanne Pfeiffer, NP  Cardiologist:  Diannah SHAUNNA Maywood, MD Electrophysiologist:  None   History of Present Illness: Scott Robbins is a 63 y.o. male  Referred to cardiology clinic for evaluation of imaging evidence of coronary calcifications.  I reviewed and discussed CT chest lung cancer screening from May 2025 with the patient.  Asymptomatic.  No angina, DOE, dizziness, palpitations, leg swelling, syncope.  He has a family history of CAD, his mother and father had PCI and CABG in their 67s.  METs more than 4.  He is extremely physically active at baseline.  He does yard work.  No issues.  He quit smoking 3 years ago.  Past Medical History:  Diagnosis Date   Hypertension     Past Surgical History:  Procedure Laterality Date   APPENDECTOMY     COLONOSCOPY N/A 02/26/2017   Procedure: COLONOSCOPY;  Surgeon: Shaaron Lamar HERO, MD;  Location: [facility];  Service: Endoscopy;  Laterality: N/A;  1:15 pm   POLYPECTOMY  02/26/2017   Procedure: POLYPECTOMY;  Surgeon: Shaaron Lamar HERO, MD;  Location: [facility];  Service: Endoscopy;;  descending    Current Outpatient Medications  Medication Sig Dispense Refill   aspirin  EC 81 MG EC tablet Take 1 tablet (81 mg total) by mouth daily.     atorvastatin  (LIPITOR) 40 MG tablet Take 1 tablet (40 mg total) by mouth every evening. 30 tablet 2   blood glucose meter kit and supplies Dispense based on patient and insurance preference. Use up to four times daily as directed. (FOR ICD-10 E10.9, E11.9). 1 each 0   levothyroxine (SYNTHROID) 50 MCG tablet Take 50 mcg by mouth daily before breakfast.     lisinopril  (ZESTRIL ) 20 MG tablet Take 20 mg by mouth in the morning.     vitamin B-12 (CYANOCOBALAMIN) 250 MCG tablet Take 250 mcg by mouth daily.     No current facility-administered medications for this visit.   Allergies:  Patient has no known  allergies.   Social History: The patient  reports that he quit smoking about 3 years ago. His smoking use included cigarettes. He has a 12.5 pack-year smoking history. He has never used smokeless tobacco. He reports current alcohol use of about 5.0 standard drinks of alcohol per week. He reports that he does not use drugs.   Family History: The patient's family history is not on file.   ROS:  Please see the history of present illness. Otherwise, complete review of systems is positive for none  All other systems are reviewed and negative.   Physical Exam: VS:  BP 130/82   Pulse 80   Ht 6' (1.829 m)   Wt 204 lb 6.4 oz (92.7 kg)   SpO2 96%   BMI 27.72 kg/m , BMI Body mass index is 27.72 kg/m.  Wt Readings from Last 3 Encounters:  01/06/24 204 lb 6.4 oz (92.7 kg)  10/11/19 208 lb (94.3 kg)  02/26/17 187 lb (84.8 kg)    General: Patient appears comfortable at rest. HEENT: Conjunctiva and lids normal, oropharynx clear with moist mucosa. Neck: Supple, no elevated JVP or carotid bruits, no thyromegaly. Lungs: Clear to auscultation, nonlabored breathing at rest. Cardiac: Regular rate and rhythm, no S3 or significant systolic murmur, no pericardial rub. Abdomen: Soft, nontender, no hepatomegaly, bowel sounds present, no guarding or rebound. Extremities: No pitting edema, distal pulses  2+. Skin: Warm and dry. Musculoskeletal: No kyphosis. Neuropsychiatric: Alert and oriented x3, affect grossly appropriate.  Recent Labwork: No results found for requested labs within last 365 days.     Component Value Date/Time   CHOL 163 10/12/2019 0556   TRIG 107 10/12/2019 0556   HDL 30 (L) 10/12/2019 0556   CHOLHDL 5.4 10/12/2019 0556   VLDL 21 10/12/2019 0556   LDLCALC 112 (H) 10/12/2019 0556    Assessment and Plan:  Moderate coronary artery calcifications - CT chest lung cancer screening from May 2025 revealed moderate coronary calcifications as well as calcifications around the aortic and  mitral valve. - He has a family history of CAD, his mother and father had PCI and CABG in 63s. - Asymptomatic.  No angina or DOE.  His METs are more than 4.  Extremely physically active. - Continue aspirin  81 mg once daily, atorvastatin  40 mg nightly. - Discussed symptoms of CAD and MI with the patient. - Heart healthy diet and exercise recommendations provided. - ER precautions for chest pain provided.  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  No indication for echocardiogram.  HTN, controlled - Continue lisinopril  20 mg once daily.  Goal BP less than 130 mmHg SBP.  HLD, at goal - Continue atorvastatin  40 mg nightly.  LDL 99 in April 2025.   40 minutes spent in reviewing the prior records, imaging, more than 3 labs, discussion of the above problems with the patient and documentation.  Medication Adjustments/Labs and Tests Ordered: Current medicines are reviewed at length with the patient today.  Concerns regarding medicines are outlined above.    Disposition:  Follow up prn  Signed Beauregard Jarrells Arleta Maywood, MD, 01/06/2024 2:10 PM    [facility] at [facility] [address] [place], [place], [place] [address]

## 2024-01-06 NOTE — Patient Instructions (Addendum)
